# Patient Record
Sex: Male | Born: 2000 | Race: Black or African American | Hispanic: No
Health system: Southern US, Community
[De-identification: ages and names within clinical notes are randomized; demographics above are authoritative.]

## PROBLEM LIST (undated history)

## (undated) DIAGNOSIS — L309 Dermatitis, unspecified: Secondary | ICD-10-CM

## (undated) DIAGNOSIS — E063 Autoimmune thyroiditis: Secondary | ICD-10-CM

## (undated) DIAGNOSIS — E0500 Thyrotoxicosis with diffuse goiter without thyrotoxic crisis or storm: Secondary | ICD-10-CM

## (undated) HISTORY — DX: Autoimmune thyroiditis: E06.3

## (undated) HISTORY — PX: REDUCTION OF TORSION OF TESTIS: SUR1096

---
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
## 2022-01-17 ENCOUNTER — Encounter (INDEPENDENT_AMBULATORY_CARE_PROVIDER_SITE_OTHER): Payer: Self-pay

## 2022-01-31 ENCOUNTER — Encounter (INDEPENDENT_AMBULATORY_CARE_PROVIDER_SITE_OTHER): Payer: Self-pay

## 2022-02-11 NOTE — Progress Notes (Unsigned)
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   HISTORY OF PRESENT ILLNESS:   Corey Charles is a 21 y.o. African-American young man.  Corey Charles was unaccompanied.  1. Corey Charles's initial pediatric endocrine evaluation occurred on 04/18/18.   A. Perinatal history: Gestational Age: [redacted]w[redacted]d; 8 lb (3.629 kg); Healthy newborn  B. Infancy: Healthy  C. 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 No other surgeries; No allergies to medications, but he does have seasonal allergies, for which he takes Zyrtec.  D. Chief complaint:   1). He went to a neurologist at [facility] in the Spring of 2018 for evaluation of a tremor. His thyroid tests were hyperthyroid. Corey Charles was also having a fast heart rate, insomnia early awakening, and weight loss associated with eating less. He was also somewhat more irritable. He was anxious and also had difficulty with concentrating and thinking. He did not have any [place] or nuclear  medicine studies. He started on propranolol which helped his tremor, heart rate, and sleeping difficulties.  He also started on methimazole (MTZ). The propranolol was tapered prior to starting school in August 2018.    2). His symptoms and his MTZ doses have varied with time. He currently takes 10 mg of MTZ per day. Family became concerned that he saw a different doctor every time he went to [name], so they requested a referral to [place]. The Corey Charles record, however, indicated that he saw Corey Charles for all of his pediatric endocrinology visits, most recently on 08/20/17. At that visit he was supposed to be taking 15 mg of MTZ twice daily. He was  supposed to have lab tests drawn and return to clinic in 4 months. After reviewing the lab results from that visit, Corey Charles reduced the MTZ dose to 15 mg daily.     3). On 09/27/17, presumably after reviewing the lab results from 09/19/17,  Corey Charles reduced his MTZ dose to 5 mg/day. Corey Charles was supposed to repeat labs in 2 weeks. There is no clinic record of any review of the TFTs from 10/29/17, but I couldn't access the patient's portal to see if there was any further communication with the family.   E. Pertinent family history:   1). Thyroid disease: Paternal aunt has hypothyroidism, but mom does not know why. Maternal great grandmother had a thyroidectomy, but mom also does not know why.    2). Obesity: Mom weighs 330 pounds. Maternal great grandmother weighs 600 pounds.    3). DM: Maternal half-uncle had juvenile diabetes. Paternal aunt with hypothyroidism also has T2DM.    4). ASCVD: Some heart disease on dad's side.   5). Cancers: Some on dad's side.   7). Others: Paternal grandfather died before the age of 62 due to lupus. Familial tremor in mom, maternal grandmother, maternal aunt. [Addendum 10/10/21: Mother, sister, and several other family members have hand tremor.]  F. Lifestyle:   1). Family diet: Balanced American diet   2). Physical activities: He used to run x-country.  G. On physical exam, Corey Charles's heart rate was 84. He was alert, but somewhat mentally sluggish. His eyes were normal. He had a 1+ tongue tremor and grade 1-2+ bruits. He had a diffusely enlarged thyroid gland  that was 21 grams in size. He had a 2-+ gross tremor of both hands and trace palmar erythema. TSH was 0.01, free T4 3.7 (ref 0.8-1.4), free T3 17.5 (ref 3.0-4.7), TSI 279, TPO antibody >900, and thyroglobulin antibody <1.   H. Assessment and Plan: It appeared that Corey Charles had both Graves' disease and Hashimoto's thyroiditis, but the Graves' disease was dominant. I increased his methimazole dose to 20 mg, twice daily.    2. Clinical course:  A. During the next year, Corey Charles's TFTs fluctuated between hypothyroidism and hyperthyroidism, but were mostly hyperthyroid. Part of the problem was the interplay of Graves' disease, Hashimoto's disease, and methimazole therapy. Part of the problem was Corey Charles's inconsistency in taking methimazole.   B. At [facility] clinic visit on 05/07/19, we discussed Corey Charles's case and his inability to take methimazole consistently.  I continued his methimazole dose of 60 mg, three times daily and reduced his Inderal to 10 mg, twice daily. I discussed the option of thyroidectomy with Corey Charles and his mother. They asked me to arrange a surgical consultation.   C. On 05/11/19, I called Corey Charles, an endocrine surgeon  at [facility], to ask her to see Corey Charles and his family for the option of thyroid surgery. She graciously agreed to do so. Corey Charles saw Corey Charles on 05/19/19. She then performed a total thyroidectomy on 06/01/19. 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   D. Corey Charles started Corey Charles on levothyroxine, 112 mcg/day after surgery. When Corey Charles saw Corey Charles in follow up on 06/16/19 he was doing well. He has fully recovered since then.   E. I have attempted to increase his levothyroxine doses over time, but he has frequently been non-compliant with taking the medication.  F. On 11/21/20 Corey Charles passed out at school. He was taken to urgent care, where his HR was in the 40s and his CBG was 51. His TFTs were profoundly low, in large part due to him not taking his thyroid hormone reliably.. I increased his thyroid hormone dose to 250 mcg/day. He was supposed to have repeated his TFTs in 6 weeks, but did not. He also was a No show for follow up appointments in 12/07/20, 02/01/21, 03/08/21 and cancelled 2 appointments on 03/13/21.4  3. [facility] visit occurred on  10/10/21. At that visit I continued his levothyroxine dose of 175 mcg/day for 6 days each week and 1/2 tablet on the seventh days.  He was supposed to have repeat lab tests for the second time, but did not.    A. In the interim he has been healthy.  B. He is tired, in part due to not getting as much sleep and in part to missing some doses of thyroid hormone. He has had a lot of homework and has been involved in a Consulting civil engineer. He is not unusually warm or cold. Mental functioning is good. He is "fine" emotionally.  His appetite is normal. He is eating more regularly to prevent low BG.   C. He continues on his levothyroxine dosage of 175 mcg/day for 6 days each week and 1/2 tablet on the seventh days. He says he has missed many doses, averaging about 2-3 per week.     4. Pertinent Review of Systems:  Constitutional: Randale feels "fine, but tired". He can't remember as well.   Eyes: Vision seems to be good with his glasses. There is no sensation of restriction to upward  and lateral eye movements. There are no recognized eye problems. Neck: The patient has had some recent complaints of soreness in his anterior neck adjacent to the surgical scar.    Heart: Heart rate increases with exercise or other physical activity. He has no complaints of palpitations, irregular heart beats, chest pain, or chest pressure.   Gastrointestinal: Bowel movents seem normal. He has no complaints of excessive hunger, acid reflux, upset stomach, stomach aches or pains, diarrhea, or constipation.  Hands: He still has some tremor.   Legs: Muscle mass and strength seem pretty normal. There are no complaints of numbness, tingling, burning, or pain. No edema is noted. He can go up and down stairs well.  Feet: There are no complaints of numbness, tingling, burning, or pain. No edema is noted. Neurologic: There are no recognized problems with muscle movement and strength, sensation, or coordination. GU: He has full pubic hair  and axillary hair.   PAST MEDICAL, FAMILY, AND SOCIAL HISTORY  Past Medical History:  Diagnosis Date   Eczema    Graves disease    Hashimoto's disease     Family History  Problem Relation Age of Onset   Post-traumatic stress disorder Father    Hypertension Father    Hyperlipidemia Father    Hypertension Maternal Grandmother    Lupus Paternal Grandfather      Current Outpatient Medications:    acetaminophen (TYLENOL) 500 MG tablet, Take 2 tablets (1,000 mg total) by mouth every 6 (six) hours. (Patient not taking: Reported on 03/23/2020), Disp: 30 tablet, Rfl: 0   Calcium-Phosphorus-Vitamin D (CALCIUM GUMMIES PO), Take 4 tablets by mouth daily., Disp: , Rfl:    calcium-vitamin D (OSCAL 500/200 D-3) 500-200 MG-UNIT tablet, Take 1 tablet by mouth 3 (three) times daily for 14 days., Disp: 42 tablet, Rfl: 0   cetirizine (ZYRTEC) 10 MG chewable tablet, Chew 10 mg by mouth daily as needed (allergies.)., Disp: , Rfl:    FLUoxetine (PROZAC) 20 MG capsule, Take 20 mg by mouth daily.  (Patient not taking: Reported on 03/20/2021), Disp: , Rfl:    HYDROCORTISONE EX, Apply 1 application. topically 4 (four) times daily as needed (eczema)., Disp: , Rfl:    hydrOXYzine (VISTARIL) 25 MG capsule, Take 25 mg by mouth at bedtime., Disp: , Rfl:    Lactobacillus Rhamnosus, GG, (CULTURELLE) CAPS, Take by mouth., Disp: , Rfl:    levothyroxine (SYNTHROID) 125 MCG tablet, Take two tablets every morning., Disp: 60 tablet, Rfl: 6   levothyroxine (SYNTHROID) 175 MCG tablet, TAKE 1 TABLET BY MOUTH DAILY FOR 6 DAYS A WEEK, TAKE 1/2 TABLET ON SEVENTH DAY, Disp: 90 tablet, Rfl: 3   Melatonin 3 MG TABS, Take 3 mg by mouth at bedtime as needed (sleep).  (Patient not taking: Reported on 12/22/2019), Disp: , Rfl:    polyethylene glycol powder (GLYCOLAX/MIRALAX) 17 GM/SCOOP powder, Take 17 g by mouth daily as needed (constipation)., Disp: , Rfl:    Probiotic Product (PROBIOTIC PO), Take 1 capsule by mouth daily as needed  (digestive regularity). (Patient not taking: Reported on 12/22/2019), Disp: , Rfl:    Skin Protectants, Misc. (EUCERIN) cream, Apply 1 application topically 4 (four) times daily as needed (eczema).  (Patient not taking: Reported on 03/23/2020), Disp: , Rfl:    tretinoin (RETIN-A) 0.05 % cream, , Disp: , Rfl:   Allergies as of 02/12/2022   (No Known Allergies)     reports that she has never smoked. She has been exposed to tobacco smoke. She has never  used smokeless tobacco. She reports that she does not drink alcohol and does not use drugs. Pediatric History  Patient Parents   Durward Mallard (Mother)   Hoffmaster,DONTEZ (Father)   Other Topics Concern   Not on file  Social History Narrative   Lives at with mom, brother, and dad.    He will start 11th grade at [employer].    He enjoys acting, walking, and hanging out with friends.     1. School and Family: He is in the 12th grade. He has applied to several colleges.  He thinks he wants to go to community college for two years and then go on to a full college program. He is doing well. He lives with his parents and brother. 2. Activities: He is more active. He has PE class at school. He participates in many extra-curricular activities, to include a Consulting civil engineer .   3. Primary Care Provider: Jaci Standard, MD  REVIEW OF SYSTEMS: There are no other significant problems involving Rahmir's other body systems.    Objective:  Objective  Vital Signs:  There were no vitals taken for this visit.   Ht Readings from Last 3 Encounters:  10/10/21 6' 2.41" (1.89 m) (96 %, Z= 1.79)*  03/20/21 6' 2.02" (1.88 m) (96 %, Z= 1.70)*  09/01/20 6' 3.2" (1.91 m) (99 %, Z= 2.20)*   * Growth percentiles are based on CDC (Boys, 2-20 Years) data.   Wt Readings from Last 3 Encounters:  10/10/21 164 lb 9.6 oz (74.7 kg) (71 %, Z= 0.56)*  06/29/21 157 lb 3.2 oz (71.3 kg) (64 %, Z= 0.35)*  03/20/21 164 lb 12.8 oz (74.8 kg) (75 %, Z= 0.66)*    * Growth percentiles are based on CDC (Boys, 2-20 Years) data.   HC Readings from Last 3 Encounters:  No data found for [facility]   There is no height or weight on file to calculate BSA. No height on file for this encounter. No weight on file for this encounter.  PHYSICAL EXAM:  Constitutional: Blaydon looks good today. His height is plateauing about the 96.35%. He gained 7 pounds to the 71.31%. His BMI had decreased to the 33.27%. He is alert and more interactive today. His affect is still relatively flat. His insight is normal. Ears: Normal range of motion.  Eyes: There is no arcus or proptosis. He has a full  Mouth: The oropharynx appears normal. The tongue appears normal. There is no tongue tremor.  There is normal oral moisture. There is no obvious gingivitis. Neck: There are no bruits present. The thyroid gland is absent. He has the typical post-thyroidectomy induration of his strap muscles, more on the left than on the right..  Lungs: The lungs are clear. Air movement is good. Heart: The heart rhythm and rate appear normal. Heart sounds S1 and S2 are normal. I do not appreciate any pathologic heart murmurs. Abdomen: The abdomen is normal in size. Bowel sounds are normal. The abdomen is soft and non-tender. There is no obviously palpable hepatomegaly, splenomegaly, or other masses.  Arms: Muscle mass appears appropriate for age.His fingernails are no longer painted black.  Hands: There is a 1+ tremor. Phalangeal and metacarpophalangeal joints appear normal. Palms are normal. Several fingernails are pallid.  Legs: Muscle mass appears appropriate for age. There is no edema.  Neurologic: Muscle strength is normal for age and gender in the upper extremities. The muscle strength is the lower extremities is better. Muscle tone appears normal.  Sensation to touch is normal in the legs.  LAB DATA:   No results found for this or any previous visit (from the past 672 hour(s)).   Labs 10/10/21: CMP  normal; CBC normal, except WBC 4.0 (ref 4.5-13.0), iron 91 (ref 27-164)  Labs 06/16/21: TSH 4.16, free T4 1.9, free T3 4.3, TSI <89; CMP normal; CBC normal, except WBC 3.7 (ref 4.5-13.0) and neutrophils 1158 (ref 1200-5200) iron 91  Labs 03/20/21: TSH >150, free T4 , free T3 1.3; CMP normal, except creatinine 1.22 (ref 0.60-1.24) ; CBC normal, except WBC 4.0 (ref 4.6-15.), neutrophils 1112 (ref 1200-5200) and MCV 98.6 (ref 78-98) , iron 128  Labs 11/21/20: TSH 104.379, free T4 0.74, free T3 1.6  Labs 03/23/20: TSH 0.38, free T4 1.9, free T3 4.9; CMP normal; CBC normal, except WBC count 3.5 (ref 4.5-13.0) and neutrophils 1705 (ref 1800-8000)  Labs 12/22/19: TSH >150, free T4 0.8, free T3 1.1; CMP normal, except creatinine 1.55, CBC normal, except WBC 3.7 (ref 4.5-5.7)  Labs 08/20/19: TSH 73.04, free T4 1.2, free T3 2.1 (ref 3.0-4.7); potassium 3.7  Labs 04/30/19: TSH 0.01, free T4 1.6, free T3 5.7%, TSI 121 (ref <140, but many endocrinologist want the TSI to be <100)  Labs 03/11/19: TSH <0.01, free T4 1.6, free T3 5.4. TSI 244; CBC normal except WBC 4.4 (ref 4.5-13.0, but increased from 4.1 in August 2020); CMP normal, except ALT 34 (ref 7-32, but decreased from 63 in August 2020  Labs 02/05/19: TSH <0.01, free T4 1.8, free T3 6.3, TSI 268  Labs 11/24/18; TSH 0.01, free T4 3.2, free T3 15, TSI 230; CBC normal, except WBC 3.3 (ref 4.5-13.0) and neutrophils 1548 (ref 1800-8000)  Labs 09/01/18: TSH <0.01, free T4 2.7, free T3 9.0, TSI  155; CMP normal, except ALT 36 (ref 7-32); CBC normal, except WBC 3.7 (ref 4.5-13.0)  Labs 07/23/18: TSH 0.01, free T4 1.9, free T3 6.1, TSI 173, CMP normal; CBC with WBC 3.2, PMNs 1,408  Labs 06/23/18: TSH 0.01, free T4 2.6, free T3 11.1, TSI 165; CMP normal; CBC with WBC 5.1, PMNs 3,570  Labs 05/23/18: TSH 0.1, free T4 2.1, free T3 8.0; CBC normal, except WBC 3.7 (ref 4.5-13.0), PMNs 1.769 (ref 1800-8000)  Labs 04/26/18: TSH 0.01, free T4 3.7 (ref 0.8-1.4), free T3  17.5 (ref 3.0-4.7), TSI 279 (ref <140), TPO antibody >900 (reg <9), thyroglobulin antibody <1; CMP normal; CBC normal, except WBC 3.5 and PMNs 1,488   Labs 10/29/17: TSH 3.481, free T4 1.00, total T3 1.7  Labs 09/19/17: TSH 12.48, free T4 0.82, TSI 2.1  Labs 08/20/17: TSH 12.30, free T4 0.71, T3 1.8, TRAb 3.54 (ref 0-1.75)  Labs 04/09/17: TSH 6.437, free T4 0.58, T3 1.6  Labs 02/14/17: TSH <0.015, free T4 1.31, T3 2.6  Labs 01/15/17: TSH <0.015, free T4 2.14, T3 2.9  Labs 01/01/17: TSH <0.015, free T4 2.79, T3 3.2  Labs 6.26.18: TSH <0.015, free T4 2.75, T3 3.6  Labs 11/22/16: TSH <0.015, free T4 3.05, T3   Labs 11/06/16: TSH <0.020, free T4 4.0 (ref 0.80-2.0), total T3 6.4 (ref 1.-1.7), TSI 5.7 (ref < 1.3)    Assessment and Plan:  Assessment  ASSESSMENT:  1-3. Diffuse thyrotoxicosis with goiter (Graves disease)/Hashimoto's thyroiditis/post-surgical hypothyroidism:  A. Oluwajomiloju had the clinical history, physical exam, and lab evidence for the combination of Graves' Disease and Hashimoto's disease. .   B. Due to our inability to control his Graves' disease with methimazole, I referred him for surgical consultation to Corey.  Duanne Guess. Corey. Lady Gary performed a total thyroidectomy in December 2020. His TFTs in March 2021 were quite hypothyroid, so I increased his levothyroxine dose to 137 mcg/day. His TFTs were even more hypothyroid in July 2021, so I increased his dose even more.   Corey Charles has recovered nicely. He appears to be clinically euthyroid today, or perhaps a bit hypothyroid. It would be helpful if he would reliably take his levothyroxine. We will repeat his TFTs now.   D. We will follow his clinical course and TFTs over time. We will attempt to achieve a TSH value in the goal range of 1.0-2.0.    4. Tremor: This finding was due to a combination of thyrotoxicosis and familial tremor. The hand tremor and tongue tremor had resolved at his July 2021 visit. The hand tremor was present  again at his last two visits and is still present mildly today. 5. Weight loss, unintentional: He has re-gained the weight he lost. He says his appetite is better.   6. Neutropenia, relative:   A. Corey Charles had a relatively low WBC count and relatively low neutrophil count in November 2019. This condition is a normal variant seen in African-Americans, especially African-American men.   B. In September 2020 his WBC was higher than it had been in the past year. These relatively low counts did not appear to be due to MTZ treatment. His WBC count was lower in July 2021, many months after stopping methimazole.  His neutrophil count and PMN count in October were below the reference range again and were a bit lower.  7. Elevated transaminase:   A. His AST and ALT were both elevated in August 2020. Although I increased his MTZ doses in August, his AST had normalized in September. His ALT in September was still elevated, but much lower than in August. His LFTs in March, July, and October 2021 were normal.   B. The fact that his LFTs improved on higher doses of MTZ indicates that the elevations in LFTs were due to his hyperthyroidism, not due to his MTZ.  8. 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   9. Pallor: We need to check his CBC and iron today.  10. Noncompliance: Corey Charles did not cooperate very well with taking MTZ in the past. He says he is doing a better job now of taking thyroid hormone, but still misses doses frequently. He needs to take his levothyroxine regularly.    PLAN:  1. Diagnostic:  Repeat TFTs, CMP, CBC, and iron today 2. Therapeutic: Continue the levothyroxine dose of 175 mcg/day for 6 days each week, but 1/2 tablet on the seventh days.    3. Patient education: We discussed all of the above at great length.  4. Follow-up: 4 months     Level of Service: This visit lasted in excess of 55 minutes. More than 50% of the visit was devoted to counseling Corey Charles.   Molli Knock,  MD, CDE Pediatric and Adult Endocrinology

## 2022-02-12 ENCOUNTER — Encounter (INDEPENDENT_AMBULATORY_CARE_PROVIDER_SITE_OTHER): Payer: Self-pay | Admitting: "Endocrinology

## 2022-02-12 ENCOUNTER — Ambulatory Visit (INDEPENDENT_AMBULATORY_CARE_PROVIDER_SITE_OTHER): Payer: BC Managed Care – PPO | Admitting: "Endocrinology

## 2022-02-12 VITALS — BP 112/70 | HR 70 | Ht 74.41 in | Wt 163.4 lb

## 2022-02-12 DIAGNOSIS — D708 Other neutropenia: Secondary | ICD-10-CM | POA: Diagnosis not present

## 2022-02-12 DIAGNOSIS — R231 Pallor: Secondary | ICD-10-CM | POA: Diagnosis not present

## 2022-02-12 DIAGNOSIS — F649 Gender identity disorder, unspecified: Secondary | ICD-10-CM

## 2022-02-12 DIAGNOSIS — R251 Tremor, unspecified: Secondary | ICD-10-CM

## 2022-02-12 DIAGNOSIS — E890 Postprocedural hypothyroidism: Secondary | ICD-10-CM

## 2022-02-12 DIAGNOSIS — R7401 Elevation of levels of liver transaminase levels: Secondary | ICD-10-CM

## 2022-02-12 NOTE — Patient Instructions (Signed)
Janelle Floor will follow up with  Dr. Vanessa Adams in 4 months.   At [facility], we are committed to providing exceptional care. You will receive a patient satisfaction survey through text or email regarding your visit today. Your opinion is important to me. Comments are appreciated.

## 2022-02-13 LAB — COMPREHENSIVE METABOLIC PANEL
AG Ratio: 1.7 (calc) (ref 1.0–2.5)
ALT: 9 U/L (ref 8–46)
AST: 15 U/L (ref 12–32)
Albumin: 4.5 g/dL (ref 3.6–5.1)
Alkaline phosphatase (APISO): 52 U/L (ref 46–169)
BUN: 10 mg/dL (ref 7–20)
CO2: 22 mmol/L (ref 20–32)
Calcium: 9.3 mg/dL (ref 8.9–10.4)
Chloride: 105 mmol/L (ref 98–110)
Creat: 1.1 mg/dL (ref 0.60–1.24)
Globulin: 2.7 g/dL (calc) (ref 2.1–3.5)
Glucose, Bld: 63 mg/dL — ABNORMAL LOW (ref 65–139)
Potassium: 3.8 mmol/L (ref 3.8–5.1)
Sodium: 140 mmol/L (ref 135–146)
Total Bilirubin: 0.6 mg/dL (ref 0.2–1.1)
Total Protein: 7.2 g/dL (ref 6.3–8.2)

## 2022-02-13 LAB — IRON: Iron: 118 ug/dL (ref 27–164)

## 2022-02-13 LAB — TSH: TSH: 62.7 mIU/L — ABNORMAL HIGH (ref 0.50–4.30)

## 2022-02-13 LAB — T4, FREE: Free T4: 1 ng/dL (ref 0.8–1.4)

## 2022-02-13 LAB — T3, FREE: T3, Free: 2.3 pg/mL — ABNORMAL LOW (ref 3.0–4.7)

## 2022-03-16 ENCOUNTER — Encounter (INDEPENDENT_AMBULATORY_CARE_PROVIDER_SITE_OTHER): Payer: Self-pay

## 2022-05-23 ENCOUNTER — Encounter (INDEPENDENT_AMBULATORY_CARE_PROVIDER_SITE_OTHER): Payer: Self-pay | Admitting: Pediatric Endocrinology

## 2022-05-23 ENCOUNTER — Ambulatory Visit (INDEPENDENT_AMBULATORY_CARE_PROVIDER_SITE_OTHER): Payer: BC Managed Care – PPO | Admitting: Pediatric Endocrinology

## 2022-05-23 VITALS — BP 112/70 | HR 58 | Ht 73.98 in | Wt 158.0 lb

## 2022-05-23 DIAGNOSIS — E890 Postprocedural hypothyroidism: Secondary | ICD-10-CM

## 2022-05-23 DIAGNOSIS — F649 Gender identity disorder, unspecified: Secondary | ICD-10-CM

## 2022-05-23 MED ORDER — LEVOTHYROXINE SODIUM 175 MCG PO TABS: ORAL_TABLET | ORAL | 3 refills | Status: DC

## 2022-05-23 NOTE — Progress Notes (Unsigned)
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 Pronouns are "she, her, and hers".   HISTORY OF PRESENT ILLNESS:   Corey Charles is a 21 y.o. African-American young woman (Assigned male at birth - AAMB).  Corey Charles was unaccompanied.  1. Corey Charles's  initial pediatric endocrine evaluation occurred on 04/18/18. She went to a neurologist at [facility] in the Spring of 2018 for evaluation of a tremor. Her thyroid tests were hyperthyroid. Corey Charles was also having a fast heart rate, insomnia early awakening, and weight loss associated with eating less. She was also somewhat more irritable. She was anxious and also had difficulty with concentrating and thinking. TSH was 0.01, free T4 3.7 (ref 0.8-1.4), free T3 17.5 (ref 3.0-4.7), TSI 279, TPO antibody >900, and thyroglobulin antibody <1.  Corey Charles had a total thyroidectomy on 06/01/19. .  2. Corey Charles's last Pediatric Specialists visit occurred on 02/12/22 with Dr. Fransico Michael. At that visit she disclosed her gender identity as being male. Dr. Fransico Michael recommended that she follow up with me for her next visit.   She is currently meant to be taking 175 mcg of levothyroxine daily. She feels that in general she has done well with this dose. She says that she did miss a few doses over the fall holiday.  She denies any symptoms of either hypo or hyperthyroidism.   She has come to the point in her life where she is ready to start working on her gender affirmation. She is working with an Manufacturing systems engineer. Her family is aware and is supportive of her transition. She has transitioned socially and is using Corey Charles and she/her pronouns all of the time.   She is taking classes at [employer].   She  is interested in learning about options for Gender Affirming Hormonal Therapy. She would like to start soon.   She is interested in blockers if they are covered by her insurance. She is not interested in preservation of sexual function or fertility. She is hoping to have "bottom surgery" in the future.   Corey Charles says that she fully realized that she was male when she was about 62. When she realized it she also realized that she had "always felt that way". She remembered behaviors from when she was a child that she had pushed to the back of her mind thinking that she was just an "effeminate gay guy".  She remembers that when she would pretend to be an actor from a show she always picked male characters. She says that her father expected her to have pretty rigid gender toys/clothes. She did not have dolls or shoes or anything fancy but she would try to "dress up" using clothing anyway. She always wanted to be "a real woman". She has always wanted longer hair- but her parents always cut her hair. Because her hair was "4C hair" it was always really difficult to manage. Once she got older and could manage her hair she started to grow it out. She says that now that her hair is shoulder length she feels "so much better". She felt that her hair never matched her inner image of what she should look like. She has had an uncomfortabl-ity with her body. She has desired to look different in the way  that she wanted her body to look more feminine with softer appeal. She also has concerns about her jaw line, her height, and her voice tone. She hated looking at video of herself walking/talking/carrying herself. She felt that she always projected too masculine.   Once she got to middle/high school she learned about the [employer] and became much more comfortable with herself. She felt that she would have liked to have been born a girl but did not self identify as male. She thinks that this was internalized fear based  on messaging she was getting from her dad about what it meant to be a man.   She came out as gay at age 53 (2020). Her mom took it "pretty fine" but also said that she thought it was "just a phase" and "we all go through that".  She did not intentionally disclose to dad but he let her know that he had heard and that he would always support her and she would always be his son. However, after her parents separated and she was staying with him during the Covid Pandemic he started into a lot of anti-feminine talk and "I know you're gay BUT...". She has told him that she is transgender but he has never responded. She lives full time with her mom now. She does have some interactions with her dad but they do not talk about her gender.   She denies every trying to hurt herself related to dysphoria.   She "came out" to everyone as trans in 2023 through a very long text message with a full explanation of who she is. She says that her mom was supportive. Mom is usually pretty good but she sometimes slips up. She is currently getting it wrong more often than she gets it right. Corey Charles does not think it's intentional but she will dead-name her and use "he/him" pronouns. Corey Charles says that she does not correct her mom. She tries not to feel defensive about it. She doesn't want to have to correct her mom every time she opens her mouth.     3. Pertinent Review of Systems:  Constitutional: Corey Charles feels "pretty good". Her memory is better.   Eyes: Vision seems to be good with her glasses. There are no recognized eye problems. Neck: No complaints of soreness in her anterior neck adjacent to the surgical scar.    Heart: Heart rate increases with exercise or other physical activity. She has no complaints of palpitations, irregular heart beats, chest pain, or chest pressure.   Gastrointestinal: Bowel movents seem normal. She has no complaints of excessive hunger, acid reflux, upset stomach, stomach aches or pains, diarrhea, or  constipation.  Lungs: She sometimes has a "weird feeling" in her rib cage when she breathes deeply  Hands: She still has some tremor.   Legs: Muscle mass and strength seem pretty normal. There are no complaints of numbness, tingling, burning, or pain. No edema is noted. She can go up and down stairs well.  Feet: There are no complaints of numbness, tingling, burning, or pain. No edema is noted. Neurologic: There are no recognized problems with muscle movement and strength, sensation, or coordination. GU: She has full pubic hair and axillary hair.  Gender identification: She now identifies as male.    PAST MEDICAL, FAMILY, AND SOCIAL HISTORY  Past Medical History:  Diagnosis Date   Eczema    Graves disease    Hashimoto's disease     Family History  Problem Relation Age of Onset  Post-traumatic stress disorder Father    Hypertension Father    Hyperlipidemia Father    Hypertension Maternal Grandmother    Lupus Paternal Grandfather      Current Outpatient Medications:    levothyroxine (SYNTHROID) 175 MCG tablet, TAKE 1 TABLET BY MOUTH DAILY FOR 6 DAYS A WEEK, TAKE 1/2 TABLET ON SEVENTH DAY, Disp: 90 tablet, Rfl: 3   Multiple Vitamins-Minerals (MULTIVITAMIN ADULTS) TABS, Take by mouth., Disp: , Rfl:    acetaminophen (TYLENOL) 500 MG tablet, Take 2 tablets (1,000 mg total) by mouth every 6 (six) hours. (Patient not taking: Reported on 03/23/2020), Disp: 30 tablet, Rfl: 0   Calcium-Phosphorus-Vitamin D (CALCIUM GUMMIES PO), Take 4 tablets by mouth daily. (Patient not taking: Reported on 02/12/2022), Disp: , Rfl:    calcium-vitamin D (OSCAL 500/200 D-3) 500-200 MG-UNIT tablet, Take 1 tablet by mouth 3 (three) times daily for 14 days., Disp: 42 tablet, Rfl: 0   cetirizine (ZYRTEC) 10 MG chewable tablet, Chew 10 mg by mouth daily as needed (allergies.). (Patient not taking: Reported on 02/12/2022), Disp: , Rfl:    FLUoxetine (PROZAC) 20 MG capsule, Take 20 mg by mouth daily.  (Patient not  taking: Reported on 03/20/2021), Disp: , Rfl:    HYDROCORTISONE EX, Apply 1 application. topically 4 (four) times daily as needed (eczema). (Patient not taking: Reported on 02/12/2022), Disp: , Rfl:    hydrOXYzine (VISTARIL) 25 MG capsule, Take 25 mg by mouth at bedtime. (Patient not taking: Reported on 02/12/2022), Disp: , Rfl:    Lactobacillus Rhamnosus, GG, (CULTURELLE) CAPS, Take by mouth. (Patient not taking: Reported on 02/12/2022), Disp: , Rfl:    levothyroxine (SYNTHROID) 125 MCG tablet, Take two tablets every morning., Disp: 60 tablet, Rfl: 6   Melatonin 3 MG TABS, Take 3 mg by mouth at bedtime as needed (sleep).  (Patient not taking: Reported on 12/22/2019), Disp: , Rfl:    polyethylene glycol powder (GLYCOLAX/MIRALAX) 17 GM/SCOOP powder, Take 17 g by mouth daily as needed (constipation). (Patient not taking: Reported on 02/12/2022), Disp: , Rfl:    Probiotic Product (PROBIOTIC PO), Take 1 capsule by mouth daily as needed (digestive regularity). (Patient not taking: Reported on 12/22/2019), Disp: , Rfl:    Skin Protectants, Misc. (EUCERIN) cream, Apply 1 application topically 4 (four) times daily as needed (eczema).  (Patient not taking: Reported on 03/23/2020), Disp: , Rfl:    tretinoin (RETIN-A) 0.05 % cream, , Disp: , Rfl:   Allergies as of 05/23/2022   (No Known Allergies)     reports that she has never smoked. She has been exposed to tobacco smoke. She has never used smokeless tobacco. She reports that she does not drink alcohol and does not use drugs. Pediatric History  Patient Parents   Augustin Coupe (Mother)   Radilla,DONTEZ (Father)   Other Topics Concern   Not on file  Social History Narrative   Lives at with mom, brother, and dad.    She graduated at [employer]. 22-23,   Currently Freshman at [employer] doing Myrtha Mantis right now 23-24 school year.   She enjoys acting, walking, and hanging out with friends.     1. School and Family: She graduated  from high school in May 2023. She is working and going to school at [employer]. She lives with her mom and brother. They are both supportive of her transition.  2. Activities: she wants to go into interior design.   3. Primary Care Provider: Renaee Munda,  MD  REVIEW OF SYSTEMS: There are no other significant problems involving Ollivander's other body systems.    Objective:  Objective  Vital Signs:   BP 112/70 (BP Location: Right Arm, Patient Position: Sitting, Cuff Size: Large)   Pulse (!) 58   Ht 6' 1.98" (1.879 m)   Wt 158 lb (71.7 kg)   BMI 20.30 kg/m    Ht Readings from Last 3 Encounters:  05/23/22 6' 1.98" (1.879 m) (94 %, Z= 1.60)*  02/12/22 6' 2.41" (1.89 m) (96 %, Z= 1.77)*  10/10/21 6' 2.41" (1.89 m) (96 %, Z= 1.79)*   * Growth percentiles are based on CDC (Boys, 2-20 Years) data.   Wt Readings from Last 3 Encounters:  05/23/22 158 lb (71.7 kg) (59 %, Z= 0.23)*  02/12/22 163 lb 6.4 oz (74.1 kg) (68 %, Z= 0.47)*  10/10/21 164 lb 9.6 oz (74.7 kg) (71 %, Z= 0.56)*   * Growth percentiles are based on CDC (Boys, 2-20 Years) data.   HC Readings from Last 3 Encounters:  No data found for [facility]   Body surface area is 1.93 meters squared. 94 %ile (Z= 1.60) based on CDC (Boys, 2-20 Years) Stature-for-age data based on Stature recorded on 05/23/2022. 59 %ile (Z= 0.23) based on CDC (Boys, 2-20 Years) weight-for-age data using vitals from 05/23/2022.  PHYSICAL EXAM: ***  Constitutional: Corey Charles looks good today,  but is masked. Her height is plateauing about the 96.16%. She lost one pound to the 68.04%. Her BMI has decreased to the 28.30%. She is alert, but still very reserved. Her affect is still relatively flat. Her insight is normal. Today she wears a pearl necklace and very feminine loop-type ear rings. She also carries a white purse with a small pink stuffed animal on her handle. She wears a mask to hide her facial hair.  Ears: Normal range of motion.  Eyes: There is  no arcus or proptosis. She has a full range of EOMs.  Mouth: The oropharynx appears normal. The tongue appears normal. There is no tongue tremor.  There is normal oral moisture. There is no obvious gingivitis. Neck: There are no bruits present. The thyroid gland is absent. She has the typical post-thyroidectomy induration of her strap muscles, more on the left than on the right..  Lungs: The lungs are clear. Air movement is good. Heart: The heart rhythm and rate appear normal. Heart sounds S1 and S2 are normal. I do not appreciate any pathologic heart murmurs. Abdomen: The abdomen is normal in size. Bowel sounds are normal. The abdomen is soft and non-tender. There is no obviously palpable hepatomegaly, splenomegaly, or other masses.  Arms: Muscle mass appears appropriate for age.  Hands: There is a trace tremor. Phalangeal and metacarpophalangeal joints appear normal. Palms are normal. Fingernails have a feminine polish.   Legs: Muscle mass appears appropriate for age. There is no edema.  Neurologic: Muscle strength is normal for age and gender in the upper extremities. The muscle strength is the lower extremities is better. Muscle tone appears normal. Sensation to touch is normal in the legs.  LAB DATA: ***  No results found for this or any previous visit (from the past 672 hour(s)).   Lab Results  Component Value Date   TSH 62.70 (H) 02/12/2022   TSH 4.16 06/16/2021   TSH >150.00 (H) 03/20/2021   TSH 104.379 (H) 11/21/2020   Lab Results  Component Value Date   FREET4 1.0 02/12/2022   FREET4 1.9 (H) 06/16/2021  FREET4 0.5 (L) 03/20/2021   FREET4 0.74 11/21/2020      Assessment and Plan:  Assessment  ASSESSMENT:***  1-3. Diffuse thyrotoxicosis with goiter (Graves disease)/Hashimoto's thyroiditis/post-surgical hypothyroidism:  A. Corey Flooraomi had the clinical history, physical exam, and lab evidence for the combination of Graves' Disease and Hashimoto's disease. .   B. Due to our  inability to control her Graves' disease with methimazole, I referred her for surgical consultation to Dr. Duanne GuessJennifer Cannon. Dr. Lady Garyannon performed a total thyroidectomy in December 2020. Her TFTs in March 2021 were quite hypothyroid, so I increased her levothyroxine dose to 137 mcg/day. Her TFTs were even more hypothyroid in July 2021, so I increased her dose even more.   Wendie Simmer. Corey Charles has recovered nicely. She appears to be clinically euthyroid today, or perhaps a bit hypothyroid. It would be helpful if she would reliably take her levothyroxine. We will repeat her TFTs now.   D. We will follow her clinical course and TFTs over time. We will attempt to achieve a TSH value in the goal range of 1.0-2.0.    4. Tremor: This finding was due to a combination of thyrotoxicosis and familial tremor. The hand tremor and tongue tremor had resolved at her July 2021 visit. The hand tremor was present again at her last three visits and is still present mildly today. 5. Weight loss, unintentional: She has re-gained the weight she lost. She says her appetite is better.   6. Neutropenia, relative:   A. Corey Flooraomi had a relatively low WBC count and relatively low neutrophil count in November 2019. This condition is a normal variant seen in African-Americans.   B. In September 2020 her WBC was higher than it had been in the past year. These relatively low counts did not appear to be due to MTZ treatment. Her WBC count was lower in July 2021, many months after stopping methimazole.  Her neutrophil count and PMN count in October were below the reference range again and were a bit lower.  7. Elevated transaminase:   A. Her AST and ALT were both elevated in August 2020. Although I increased her MTZ doses in August, her AST had normalized in September. His ALT in September was still elevated, but much lower than in August. Her LFTs in March, July, and October 2021 were normal.   B. The fact that her LFTs improved on higher doses of MTZ  indicates that the elevations in LFTs were due to her hyperthyroidism, not due to her MTZ.   C. LFTs were still normal in December 2022 and April 2023.  8. 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   9. Pallor: We need to check her CBC and iron today.  10. Noncompliance: Corey Flooraomi did not cooperate very well with taking MTZ in the past. She says she is doing a better job now of taking thyroid hormone . 11. Gender dysphoria: Corey Flooraomi is in counseling. She is not interested in any hormonal or surgical treatments at this time.   PLAN: ***  1. Diagnostic:  *** 2. Therapeutic: Continue the levothyroxine dose of 175 mcg/day for 6 days each week, but 1/2 tablet on the seventh days.    3. Patient education: We discussed all of the above at great length.  4. Follow-up: 4 months with Dr. Vanessa DurhamBadik    Level of Service: This visit lasted in excess of 65 minutes. More than 50% of the visit was devoted to counseling [name].   Dessa PhiJennifer Kobi Mario, MD ***

## 2022-05-23 NOTE — Patient Instructions (Signed)
[email or web address]

## 2022-06-04 ENCOUNTER — Ambulatory Visit (INDEPENDENT_AMBULATORY_CARE_PROVIDER_SITE_OTHER): Payer: Self-pay | Admitting: Pediatric Endocrinology

## 2022-07-10 ENCOUNTER — Encounter (INDEPENDENT_AMBULATORY_CARE_PROVIDER_SITE_OTHER): Payer: Self-pay | Admitting: Pediatric Endocrinology

## 2022-07-10 ENCOUNTER — Ambulatory Visit (INDEPENDENT_AMBULATORY_CARE_PROVIDER_SITE_OTHER): Payer: BC Managed Care – PPO | Admitting: Pediatric Endocrinology

## 2022-07-10 VITALS — BP 110/70 | HR 60 | Ht 74.06 in | Wt 159.4 lb

## 2022-07-10 DIAGNOSIS — F649 Gender identity disorder, unspecified: Secondary | ICD-10-CM

## 2022-07-10 DIAGNOSIS — E890 Postprocedural hypothyroidism: Secondary | ICD-10-CM | POA: Diagnosis not present

## 2022-07-10 MED ORDER — ESTRADIOL 0.025 MG/24HR TD PTWK: 0.0250 mg | MEDICATED_PATCH | TRANSDERMAL | 5 refills | Status: DC

## 2022-07-10 MED ORDER — MEDROXYPROGESTERONE ACETATE 150 MG/ML IM SUSP
150.0000 mg | Freq: Once | INTRAMUSCULAR | Status: AC
  Administered 2022-07-10: 150 mg via INTRAMUSCULAR

## 2022-07-10 MED ORDER — SPIRONOLACTONE 50 MG PO TABS: 50.0000 mg | ORAL_TABLET | Freq: Every day | ORAL | 1 refills | Status: DC

## 2022-07-10 NOTE — Progress Notes (Signed)
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 Pronouns are "she, her, and hers".   HISTORY OF PRESENT ILLNESS:   Corey Charles is a 22 y.o. African-American young woman (71 male at birth - [birth year]).  Corey Charles was unaccompanied.  1. Corey Charles's  initial pediatric endocrine evaluation occurred on 04/18/18. She went to a neurologist at [facility] in the Spring of 2018 for evaluation of a tremor. Her thyroid tests were hyperthyroid. Corey Charles was also having a fast heart rate, insomnia early awakening, and weight loss associated with eating less. She was also somewhat more irritable. She was anxious and also had difficulty with concentrating and thinking. TSH was 0.01, free T4 3.7 (ref 0.8-1.4), free T3 17.5 (ref 3.0-4.7), TSI 279, TPO antibody >900, and thyroglobulin antibody <1.  Corey Charles had a total thyroidectomy on 06/01/19. .  2. Corey Charles's last Pediatric [facility]ists visit occurred on 05/23/22. In the interim she has been doing well.     Gender Dysphoria  At her last visit we discussed gender dysphoria and options for gender affirming hormone therapy. Corey Charles had many questions and wound up taking the consent forms home to review. She feels ready to sign consent today. She is excited to start this next step in her journey.   She is working with [facility] for therapy. Her therapist is on board with her transitioning.  They are planning to meet more frequently during this transition period.   Post Surgical Hypothyroidism - She is taking 175 mcg of levothyroxine. Since her last visit she has been taking it every day.    She is taking classes at [facility].       ----------------------------------- Previous  History  She is interested in blockers if they are covered by her insurance. She is not interested in preservation of sexual function or fertility. She is hoping to have "bottom surgery" in the future.   Corey Charles says that she fully realized that she was male when she was about 61. When she realized it she also realized that she had "always felt that way". She remembered behaviors from when she was a child that she had pushed to the back of her mind thinking that she was just an "effeminate gay guy".  She remembers that when she would pretend to be an actor from a show she always picked male characters. She says that her father expected her to have pretty rigid gender toys/clothes. She did not have dolls or shoes or anything fancy but she would try to "dress up" using clothing anyway. She always wanted to be "a real woman". She has always wanted longer hair- but her parents always cut her hair. Because her hair was "4C hair" it was always really difficult to manage. Once she got older and could manage her hair she started to grow it out. She says that now that her hair is shoulder length she feels "so much better". She felt that her hair never matched her inner image of what she should look like. She has had an uncomfortabl-ity with her body. She has desired to look different in the way that she wanted her body to look more feminine with softer appeal. She also has concerns about her jaw line, her height, and her voice  tone. She hated looking at video of herself walking/talking/carrying herself. She felt that she always projected too masculine.   Once she got to middle/high school she learned about the [employer] and became much more comfortable with herself. She felt that she would have liked to have been born a girl but did not self identify as male. She thinks that this was internalized fear based on messaging she was getting from her dad about what it meant to be a man.   She came out as gay at  age 83 (2020). Her mom took it "pretty fine" but also said that she thought it was "just a phase" and "we all go through that".  She did not intentionally disclose to dad but he let her know that he had heard and that he would always support her and she would always be his son. However, after her parents separated and she was staying with him during the Covid Pandemic he started into a lot of anti-feminine talk and "I know you're gay BUT...". She has told him that she is transgender but he has never responded. She lives full time with her mom now. She does have some interactions with her dad but they do not talk about her gender.   She denies every trying to hurt herself related to dysphoria.   She "came out" to everyone as trans in 2023 through a very long text message with a full explanation of who she is. She says that her mom was supportive. Mom is usually pretty good but she sometimes slips up. She is currently getting it wrong more often than she gets it right. Corey Charles does not think it's intentional but she will dead-name her and use "he/him" pronouns. Corey Charles says that she does not correct her mom. She tries not to feel defensive about it. She doesn't want to have to correct her mom every time she opens her mouth.     3. Pertinent Review of Systems:  Constitutional: Corey Charles feels "good". Eyes: Vision seems to be good with her glasses. There are no recognized eye problems. Neck: No complaints of soreness in her anterior neck adjacent to the surgical scar.    Heart: Heart rate increases with exercise or other physical activity. She has no complaints of palpitations, irregular heart beats, chest pain, or chest pressure.   Gastrointestinal: Bowel movents seem normal. She has no complaints of excessive hunger, acid reflux, upset stomach, stomach aches or pains, diarrhea, or constipation.  Lungs: No asthma or wheezing Hands: She still has some tremor.   Legs: Muscle mass and strength seem pretty normal.  There are no complaints of numbness, tingling, burning, or pain. No edema is noted. She can go up and down stairs well.  Feet: There are no complaints of numbness, tingling, burning, or pain. No edema is noted. Neurologic: There are no recognized problems with muscle movement and strength, sensation, or coordination.   PAST MEDICAL, FAMILY, AND SOCIAL HISTORY  Past Medical History:  Diagnosis Date   Eczema    Graves disease    Hashimoto's disease     Family History  Problem Relation Age of Onset   Post-traumatic stress disorder Father    Hypertension Father    Hyperlipidemia Father    Hypertension Maternal Grandmother    Lupus Paternal Grandfather      Current Outpatient Medications:    estradiol (CLIMARA - DOSED IN MG/24 HR) 0.025 mg/24hr patch, Place 1 patch (0.025 mg total) onto the skin once a week.,  Disp: 4 patch, Rfl: 5   levothyroxine (SYNTHROID) 175 MCG tablet, TAKE 1 TABLET BY MOUTH DAILY FOR 6 DAYS A WEEK, TAKE 1/2 TABLET ON SEVENTH DAY, Disp: 90 tablet, Rfl: 3   Multiple Vitamins-Minerals (MULTIVITAMIN ADULTS) TABS, Take by mouth., Disp: , Rfl:    spironolactone (ALDACTONE) 50 MG tablet, Take 1 tablet (50 mg total) by mouth daily., Disp: 90 tablet, Rfl: 1   acetaminophen (TYLENOL) 500 MG tablet, Take 2 tablets (1,000 mg total) by mouth every 6 (six) hours. (Patient not taking: Reported on 03/23/2020), Disp: 30 tablet, Rfl: 0   Calcium-Phosphorus-Vitamin D (CALCIUM GUMMIES PO), Take 4 tablets by mouth daily. (Patient not taking: Reported on 02/12/2022), Disp: , Rfl:    calcium-vitamin D (OSCAL 500/200 D-3) 500-200 MG-UNIT tablet, Take 1 tablet by mouth 3 (three) times daily for 14 days., Disp: 42 tablet, Rfl: 0   cetirizine (ZYRTEC) 10 MG chewable tablet, Chew 10 mg by mouth daily as needed (allergies.). (Patient not taking: Reported on 02/12/2022), Disp: , Rfl:    FLUoxetine (PROZAC) 20 MG capsule, Take 20 mg by mouth daily.  (Patient not taking: Reported on 03/20/2021), Disp:  , Rfl:    HYDROCORTISONE EX, Apply 1 application. topically 4 (four) times daily as needed (eczema). (Patient not taking: Reported on 02/12/2022), Disp: , Rfl:    hydrOXYzine (VISTARIL) 25 MG capsule, Take 25 mg by mouth at bedtime. (Patient not taking: Reported on 02/12/2022), Disp: , Rfl:    Lactobacillus Rhamnosus, GG, (CULTURELLE) CAPS, Take by mouth. (Patient not taking: Reported on 02/12/2022), Disp: , Rfl:    Melatonin 3 MG TABS, Take 3 mg by mouth at bedtime as needed (sleep).  (Patient not taking: Reported on 12/22/2019), Disp: , Rfl:    polyethylene glycol powder (GLYCOLAX/MIRALAX) 17 GM/SCOOP powder, Take 17 g by mouth daily as needed (constipation). (Patient not taking: Reported on 02/12/2022), Disp: , Rfl:    Probiotic Product (PROBIOTIC PO), Take 1 capsule by mouth daily as needed (digestive regularity). (Patient not taking: Reported on 12/22/2019), Disp: , Rfl:    Skin Protectants, Misc. (EUCERIN) cream, Apply 1 application topically 4 (four) times daily as needed (eczema).  (Patient not taking: Reported on 03/23/2020), Disp: , Rfl:    tretinoin (RETIN-A) 0.05 % cream, , Disp: , Rfl:   Current Facility-Administered Medications:    medroxyPROGESTERone (DEPO-PROVERA) injection 150 mg, 150 mg, Intramuscular, Once, Lelon Huh, MD  Allergies as of 07/10/2022   (No Known Allergies)     reports that she has never smoked. She has been exposed to tobacco smoke. She has never used smokeless tobacco. She reports that she does not drink alcohol and does not use drugs. Pediatric History  Patient Parents   Durward Mallard (Mother)   Other Topics Concern   Not on file  Social History Narrative   Lives at with mom, brother, and dad.    She graduated at [employer]. 22-23,   Currently Freshman at [employer] doing Jamelle Haring right now 23-24 school year.   She enjoys acting, walking, and hanging out with friends.     1. School and Family: She graduated from high school  in May 2023. She is working and going to school at [employer]. She lives with her mom and brother. They are both supportive of her transition.  2. Activities: she wants to go into interior design.  Works at [employer] and [employer].  3. Primary Care Provider: Jaci Standard, MD  REVIEW OF SYSTEMS:  There are no other significant problems involving Reginald's other body systems.    Objective:  Objective  Vital Signs:   BP 110/70 (BP Location: Left Arm, Patient Position: Sitting, Cuff Size: Large)   Pulse 60   Ht 6' 2.06" (1.881 m)   Wt 159 lb 6.4 oz (72.3 kg)   BMI 20.44 kg/m    Ht Readings from Last 3 Encounters:  07/10/22 6' 2.06" (1.881 m) (95 %, Z= 1.62)*  05/23/22 6' 1.98" (1.879 m) (94 %, Z= 1.60)*  02/12/22 6' 2.41" (1.89 m) (96 %, Z= 1.77)*   * Growth percentiles are based on CDC (Boys, 2-20 Years) data.   Wt Readings from Last 3 Encounters:  07/10/22 159 lb 6.4 oz (72.3 kg) (60 %, Z= 0.26)*  05/23/22 158 lb (71.7 kg) (59 %, Z= 0.23)*  02/12/22 163 lb 6.4 oz (74.1 kg) (68 %, Z= 0.47)*   * Growth percentiles are based on CDC (Boys, 2-20 Years) data.   HC Readings from Last 3 Encounters:  No data found for [facility]   Body surface area is 1.94 meters squared. 95 %ile (Z= 1.62) based on CDC (Boys, 2-20 Years) Stature-for-age data based on Stature recorded on 07/10/2022. 60 %ile (Z= 0.26) based on CDC (Boys, 2-20 Years) weight-for-age data using vitals from 07/10/2022.  PHYSICAL EXAM:   Physical Exam Vitals reviewed.  Constitutional:      Appearance: Normal appearance.     Comments: Presentation is male  HENT:     Head: Normocephalic.     Right Ear: External ear normal.     Left Ear: External ear normal.     Mouth/Throat:     Mouth: Mucous membranes are moist.  Eyes:     Extraocular Movements: Extraocular movements intact.  Cardiovascular:     Rate and Rhythm: Normal rate and regular rhythm.     Pulses: Normal pulses.  Pulmonary:     Effort: Pulmonary  effort is normal.     Breath sounds: Normal breath sounds.  Abdominal:     Palpations: Abdomen is soft.  Genitourinary:    Comments: Testicular volume about 20 cc BL. Texture is smooth and slightly soft.  Musculoskeletal:        General: Normal range of motion.     Cervical back: Normal range of motion.  Skin:    General: Skin is warm and dry.     Capillary Refill: Capillary refill takes less than 2 seconds.  Neurological:     General: No focal deficit present.     Mental Status: She is alert.  Psychiatric:        Mood and Affect: Mood normal.        Behavior: Behavior normal.    LAB DATA:  No results found for this or any previous visit (from the past 672 hour(s)).   Lab Results  Component Value Date   TSH 62.70 (H) 02/12/2022   TSH 4.16 06/16/2021   TSH >150.00 (H) 03/20/2021   TSH 104.379 (H) 11/21/2020   Lab Results  Component Value Date   FREET4 1.0 02/12/2022   FREET4 1.9 (H) 06/16/2021   FREET4 0.5 (L) 03/20/2021   FREET4 0.74 11/21/2020      Assessment and Plan:  Assessment  ASSESSMENT: Kohner "Corey Charles" is a 22 y.o. trans male with history of post surgical hypothyroidism (following treatment for Grave's Disease) and gender dysphoria  1) Post surgical hypothyroidism - Currently on 175 mcg daily - Has been working on taking dose consistently - Clinically euthyroid -  will get labs today with baseline gender labs  2) Gender dysphoria - Is now ready to start Gender Affirming Hormone Therapy - Reviewed options today and completed consent forms  - Will give Depo Provera in clinic today - Prescription for Climara and Spironolactone to pharmacy  PLAN:   1. Diagnostic:   Lab Orders         TSH         T4, free         Comprehensive metabolic panel         CBC         Estradiol         FSH/LH         Testosterone, Total, LC/MS/MS         Lipid panel         Prolactin      2. Therapeutic:  Patient Instructions  Depo Provera today. Will plan to give  every 3 months with clinic visits.   Start Climara patches. This is a 25 mcg patch. Place on skin on buttocks, inner thigh, or abdomen. Replace one a week. Can cover with a tegaderm or bandaid if needed to keep it on the full week.   Start Spironolactone (Aldactone) 50 mg weekly. DRINK WATER!!  Continue Levothyroxine 175 mcg daily.    3. Patient education: Discussion as above  4. Follow-up: Return in about 3 months (around 10/09/2022).     Level of Service: >40 minutes spent today reviewing the medical chart, counseling the patient/family, and documenting today's encounter.     Lelon Huh, MD

## 2022-07-10 NOTE — Patient Instructions (Signed)
Depo Provera today. Will plan to give every 3 months with clinic visits.   Start Climara patches. This is a 25 mcg patch. Place on skin on buttocks, inner thigh, or abdomen. Replace one a week. Can cover with a tegaderm or bandaid if needed to keep it on the full week.   Start Spironolactone (Aldactone) 50 mg weekly. DRINK WATER!!  Continue Levothyroxine 175 mcg daily.

## 2022-07-10 NOTE — Progress Notes (Signed)
Name of Medication: MedroxyProgesterone Acetate    NDC number: 00712-197-58   Lot Number:  [id number]    Expiration Date:  10/16/2023  Who supplied the medication?  Office supplied   Who administered the injection? Roxy Horseman, CMA, AAMA   Administration Site:  Left Deltoid    Patient supplied: No    Was the patient observed for 10-15 minutes after injection was given? Yes If not, why?      Was there an adverse reaction after giving medication? No If yes, what reaction?

## 2022-07-15 ENCOUNTER — Other Ambulatory Visit (INDEPENDENT_AMBULATORY_CARE_PROVIDER_SITE_OTHER): Payer: Self-pay | Admitting: Pediatric Endocrinology

## 2022-07-15 LAB — T4, FREE: Free T4: 1.8 ng/dL — ABNORMAL HIGH (ref 0.8–1.4)

## 2022-07-15 LAB — COMPREHENSIVE METABOLIC PANEL
AG Ratio: 1.9 (calc) (ref 1.0–2.5)
ALT: 12 U/L (ref 8–46)
AST: 15 U/L (ref 12–32)
Albumin: 5 g/dL (ref 3.6–5.1)
Alkaline phosphatase (APISO): 54 U/L (ref 46–169)
BUN: 9 mg/dL (ref 7–20)
CO2: 24 mmol/L (ref 20–32)
Calcium: 10 mg/dL (ref 8.9–10.4)
Chloride: 105 mmol/L (ref 98–110)
Creat: 0.95 mg/dL (ref 0.60–1.24)
Globulin: 2.7 g/dL (calc) (ref 2.1–3.5)
Glucose, Bld: 75 mg/dL (ref 65–139)
Potassium: 3.7 mmol/L — ABNORMAL LOW (ref 3.8–5.1)
Sodium: 141 mmol/L (ref 135–146)
Total Bilirubin: 0.6 mg/dL (ref 0.2–1.1)
Total Protein: 7.7 g/dL (ref 6.3–8.2)

## 2022-07-15 LAB — CBC
HCT: 43.4 % (ref 38.5–50.0)
Hemoglobin: 15.1 g/dL (ref 13.2–17.1)
MCH: 32.8 pg (ref 27.0–33.0)
MCHC: 34.8 g/dL (ref 32.0–36.0)
MCV: 94.3 fL (ref 80.0–100.0)
MPV: 10.4 fL (ref 7.5–12.5)
Platelets: 268 10*3/uL (ref 140–400)
RBC: 4.6 10*6/uL (ref 4.20–5.80)
RDW: 11.7 % (ref 11.0–15.0)
WBC: 3.3 10*3/uL — ABNORMAL LOW (ref 3.8–10.8)

## 2022-07-15 LAB — LIPID PANEL
Cholesterol: 159 mg/dL (ref <170)
HDL: 53 mg/dL (ref >45)
LDL Cholesterol (Calc): 91 mg/dL (calc) (ref <110)
Non-HDL Cholesterol (Calc): 106 mg/dL (calc) (ref <120)
Total CHOL/HDL Ratio: 3 (calc) (ref <5.0)
Triglycerides: 63 mg/dL (ref <90)

## 2022-07-15 LAB — TSH: TSH: 0.14 mIU/L — ABNORMAL LOW (ref 0.50–4.30)

## 2022-07-15 LAB — PROLACTIN: Prolactin: 9.6 ng/mL (ref 2.0–18.0)

## 2022-07-15 LAB — ESTRADIOL: Estradiol: 31 pg/mL (ref <=39)

## 2022-07-15 LAB — TESTOSTERONE, TOTAL, LC/MS/MS: Testosterone, Total, LC-MS-MS: 731 ng/dL (ref 250–1100)

## 2022-07-15 LAB — FSH/LH
FSH: 3.6 m[IU]/mL (ref 1.4–12.8)
LH: 6 m[IU]/mL (ref 1.5–9.3)

## 2022-10-16 ENCOUNTER — Encounter (INDEPENDENT_AMBULATORY_CARE_PROVIDER_SITE_OTHER): Payer: Self-pay | Admitting: Pediatric Endocrinology

## 2022-10-16 ENCOUNTER — Ambulatory Visit (INDEPENDENT_AMBULATORY_CARE_PROVIDER_SITE_OTHER): Payer: BC Managed Care – PPO | Admitting: Pediatric Endocrinology

## 2022-10-16 VITALS — BP 108/64 | HR 74 | Wt 169.0 lb

## 2022-10-16 DIAGNOSIS — E890 Postprocedural hypothyroidism: Secondary | ICD-10-CM | POA: Diagnosis not present

## 2022-10-16 DIAGNOSIS — F649 Gender identity disorder, unspecified: Secondary | ICD-10-CM | POA: Diagnosis not present

## 2022-10-16 MED ORDER — ESTRADIOL 0.025 MG/24HR TD PTWK: 0.0250 mg | MEDICATED_PATCH | TRANSDERMAL | 1 refills | Status: DC

## 2022-10-16 NOTE — Progress Notes (Signed)
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 Pronouns are "she, her, and hers".   HISTORY OF PRESENT ILLNESS:   Corey Charles is a 22 y.o. African-American young woman (Assigned male at birth - AMAB).  Corey Charles was unaccompanied.  1. Corey Charles's  initial pediatric endocrine evaluation occurred on 04/18/18. She went to a neurologist at [facility] in the Spring of 2018 for evaluation of a tremor. Her thyroid tests were hyperthyroid. Corey Charles was also having a fast heart rate, insomnia early awakening, and weight loss associated with eating less. She was also somewhat more irritable. She was anxious and also had difficulty with concentrating and thinking. TSH was 0.01, free T4 3.7 (ref 0.8-1.4), free T3 17.5 (ref 3.0-4.7), TSI 279, TPO antibody >900, and thyroglobulin antibody <1.  Corey Charles had a total thyroidectomy on 06/01/19. .  2. Corey Charles's last Pediatric Specialists visit occurred on 07/10/22. In the interim she has been doing well.     Gender Dysphoria  At her last visit we started GAHT with depo provera, transdermal estrogen, and Spironolactone.   She reports that she feels really good now. At first she noticed that she was very emotional with a shorter fuse than she was accustomed to. She feels that she has been adjusting to her new normal.   She has noted that her skin is started to feel softer. She has also noticed a decrease in her libido.   She has noticed that she is no longer having morning arousals. She feels that this is a "great" thing.   She does not currently have an intimate partner.   She is still working with Zane Herald for therapy. She has really enjoyed having her as a sounding board during her  transition.   Things with mom are ok- she is trying to be supportive/helpful but Corey Charles doesn't really want to be around her- not because she is being bothersome but because she feels ready to fly the nest.   No headaches or vision changes.    Post Surgical Hypothyroidism - She is taking 175 mcg of levothyroxine. Since her last visit she has been taking it every day. After her last visit we decreased one dose a week to a half tab.    She is taking classes at [employer]. She is finishing in a couple weeks. 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        ----------------------------------- Previous History  She is interested in blockers if they are covered by her insurance. She is not interested in preservation of sexual function or fertility. She is hoping to have "bottom surgery" in the future.   Corey Charles says that she fully realized that she was male when she was about 63. When she realized it she also realized that she had "always felt that way". She remembered behaviors from when she was a child that she had pushed to the back of her mind thinking that she was just an "effeminate gay guy".  She remembers that when she would pretend to be an actor from a show she always picked male characters. She says that her father expected her to have pretty rigid gender toys/clothes. She did not have dolls or shoes or anything fancy  but she would try to "dress up" using clothing anyway. She always wanted to be "a real woman". She has always wanted longer hair- but her parents always cut her hair. Because her hair was "4C hair" it was always really difficult to manage. Once she got older and could manage her hair she started to grow it out. She says that now that her hair is shoulder length she feels "so much better". She felt that her hair never matched her inner image of what she should look like. She has had an uncomfortabl-ity with her body. She has desired to look  different in the way that she wanted her body to look more feminine with softer appeal. She also has concerns about her jaw line, her height, and her voice tone. She hated looking at video of herself walking/talking/carrying herself. She felt that she always projected too masculine.   Once she got to middle/high school she learned about the [employer] and became much more comfortable with herself. She felt that she would have liked to have been born a girl but did not self identify as male. She thinks that this was internalized fear based on messaging she was getting from her dad about what it meant to be a man.   She came out as gay at age 18 (2020). Her mom took it "pretty fine" but also said that she thought it was "just a phase" and "we all go through that".  She did not intentionally disclose to dad but he let her know that he had heard and that he would always support her and she would always be his son. However, after her parents separated and she was staying with him during the Covid Pandemic he started into a lot of anti-feminine talk and "I know you're gay BUT...". She has told him that she is transgender but he has never responded. She lives full time with her mom now. She does have some interactions with her dad but they do not talk about her gender.   She denies every trying to hurt herself related to dysphoria.   She "came out" to everyone as trans in 2023 through a very long text message with a full explanation of who she is. She says that her mom was supportive. Mom is usually pretty good but she sometimes slips up. She is currently getting it wrong more often than she gets it right. Corey Charles does not think it's intentional but she will dead-name her and use "he/him" pronouns. Corey Charles says that she does not correct her mom. She tries not to feel defensive about it. She doesn't want to have to correct her mom every time she opens her mouth.     3. Pertinent Review of Systems:   Constitutional: Corey Charles feels "pretty good". Eyes: Vision seems to be good with her glasses. There are no recognized eye problems. Neck: No complaints of soreness in her anterior neck adjacent to the surgical scar.    Heart: Heart rate increases with exercise or other physical activity. She has no complaints of palpitations, irregular heart beats, chest pain, or chest pressure.   Gastrointestinal: Bowel movents seem normal. She has no complaints of excessive hunger, acid reflux, upset stomach, stomach aches or pains, diarrhea, or constipation.  Lungs: No asthma or wheezing Hands: She still has some tremor.   Legs: Muscle mass and strength seem pretty normal. There are no complaints of numbness, tingling, burning, or pain. No edema is noted. She can go up and down  stairs well.  Feet: There are no complaints of numbness, tingling, burning, or pain. No edema is noted. Neurologic: There are no recognized problems with muscle movement and strength, sensation, or coordination.   PAST MEDICAL, FAMILY, AND SOCIAL HISTORY  Past Medical History:  Diagnosis Date   Eczema    Graves disease    Hashimoto's disease     Family History  Problem Relation Age of Onset   Post-traumatic stress disorder Father    Hypertension Father    Hyperlipidemia Father    Hypertension Maternal Grandmother    Lupus Paternal Grandfather      Current Outpatient Medications:    levothyroxine (SYNTHROID) 175 MCG tablet, TAKE 1 TABLET BY MOUTH DAILY FOR 6 DAYS A WEEK, TAKE 1/2 TABLET ON SEVENTH DAY, Disp: 90 tablet, Rfl: 3   spironolactone (ALDACTONE) 50 MG tablet, Take 1 tablet (50 mg total) by mouth daily., Disp: 90 tablet, Rfl: 1   acetaminophen (TYLENOL) 500 MG tablet, Take 2 tablets (1,000 mg total) by mouth every 6 (six) hours. (Patient not taking: Reported on 03/23/2020), Disp: 30 tablet, Rfl: 0   Calcium-Phosphorus-Vitamin D (CALCIUM GUMMIES PO), Take 4 tablets by mouth daily. (Patient not taking: Reported on  02/12/2022), Disp: , Rfl:    calcium-vitamin D (OSCAL 500/200 D-3) 500-200 MG-UNIT tablet, Take 1 tablet by mouth 3 (three) times daily for 14 days., Disp: 42 tablet, Rfl: 0   cetirizine (ZYRTEC) 10 MG chewable tablet, Chew 10 mg by mouth daily as needed (allergies.). (Patient not taking: Reported on 02/12/2022), Disp: , Rfl:    estradiol (CLIMARA - DOSED IN MG/24 HR) 0.025 mg/24hr patch, Place 1 patch (0.025 mg total) onto the skin once a week., Disp: 12 patch, Rfl: 1   FLUoxetine (PROZAC) 20 MG capsule, Take 20 mg by mouth daily.  (Patient not taking: Reported on 03/20/2021), Disp: , Rfl:    HYDROCORTISONE EX, Apply 1 application. topically 4 (four) times daily as needed (eczema). (Patient not taking: Reported on 02/12/2022), Disp: , Rfl:    hydrOXYzine (VISTARIL) 25 MG capsule, Take 25 mg by mouth at bedtime. (Patient not taking: Reported on 02/12/2022), Disp: , Rfl:    Lactobacillus Rhamnosus, GG, (CULTURELLE) CAPS, Take by mouth. (Patient not taking: Reported on 02/12/2022), Disp: , Rfl:    Melatonin 3 MG TABS, Take 3 mg by mouth at bedtime as needed (sleep).  (Patient not taking: Reported on 12/22/2019), Disp: , Rfl:    Multiple Vitamins-Minerals (MULTIVITAMIN ADULTS) TABS, Take by mouth. (Patient not taking: Reported on 10/16/2022), Disp: , Rfl:    polyethylene glycol powder (GLYCOLAX/MIRALAX) 17 GM/SCOOP powder, Take 17 g by mouth daily as needed (constipation). (Patient not taking: Reported on 02/12/2022), Disp: , Rfl:    Probiotic Product (PROBIOTIC PO), Take 1 capsule by mouth daily as needed (digestive regularity). (Patient not taking: Reported on 12/22/2019), Disp: , Rfl:    Skin Protectants, Misc. (EUCERIN) cream, Apply 1 application topically 4 (four) times daily as needed (eczema).  (Patient not taking: Reported on 03/23/2020), Disp: , Rfl:    tretinoin (RETIN-A) 0.05 % cream, , Disp: , Rfl:   Allergies as of 10/16/2022   (No Known Allergies)     reports that she has never smoked. She has been  exposed to tobacco smoke. She has never used smokeless tobacco. She reports that she does not drink alcohol and does not use drugs. Pediatric History  Patient Parents   Augustin Coupe (Mother)   Other Topics Concern   Not on file  Social History Narrative   Lives at with mom, brother, and dad.    She graduated at [employer]. 22-23,   Currently Freshman at [employer] doing Myrtha Mantis right now 23-24 school year.   She enjoys acting, walking, and hanging out with friends.     1. School and Family: She graduated from high school in May 2023. She is working and going to school at [employer]. She is planning to go to [employer] in the fall.  She lives with her mom and brother. They are both supportive of her transition.  2. Activities: she wants to go into interior design.  Works at [employer] and [employer].  3. Primary Care Provider: Renaee Munda, MD  REVIEW OF SYSTEMS: There are no other significant problems involving Duncan's other body systems.    Objective:  Objective  Vital Signs:   BP 108/64   Pulse 74   Wt 169 lb (76.7 kg)   BMI 21.67 kg/m   Blood pressure %iles are not available for patients who are 18 years or older.  Ht Readings from Last 3 Encounters:  07/10/22 6' 2.06" (1.881 m) (95 %, Z= 1.62)*  05/23/22 6' 1.98" (1.879 m) (94 %, Z= 1.60)*  02/12/22 6' 2.41" (1.89 m) (96 %, Z= 1.77)*   * Growth percentiles are based on CDC (Boys, 2-20 Years) data.   Wt Readings from Last 3 Encounters:  10/16/22 169 lb (76.7 kg) (72 %, Z= 0.57)*  07/10/22 159 lb 6.4 oz (72.3 kg) (60 %, Z= 0.26)*  05/23/22 158 lb (71.7 kg) (59 %, Z= 0.23)*   * Growth percentiles are based on CDC (Boys, 2-20 Years) data.   HC Readings from Last 3 Encounters:  No data found for [facility]   Body surface area is 2 meters squared. No height on file for this encounter. 72 %ile (Z= 0.57) based on CDC (Boys, 2-20 Years) weight-for-age data using vitals from  10/16/2022.  PHYSICAL EXAM:   Physical Exam Vitals reviewed.  Constitutional:      Appearance: Normal appearance.     Comments: Presentation is male  HENT:     Head: Normocephalic.     Right Ear: External ear normal.     Left Ear: External ear normal.     Mouth/Throat:     Mouth: Mucous membranes are moist.  Eyes:     Extraocular Movements: Extraocular movements intact.  Cardiovascular:     Rate and Rhythm: Normal rate and regular rhythm.     Pulses: Normal pulses.  Pulmonary:     Effort: Pulmonary effort is normal.     Breath sounds: Normal breath sounds.  Abdominal:     Palpations: Abdomen is soft.  Genitourinary:    Comments: Testicular volume about 20 cc BL. Texture is smooth and slightly soft.  Musculoskeletal:        General: Normal range of motion.     Cervical back: Normal range of motion.  Skin:    General: Skin is warm and dry.     Capillary Refill: Capillary refill takes less than 2 seconds.  Neurological:     General: No focal deficit present.     Mental Status: She is alert.  Psychiatric:        Mood and Affect: Mood normal.        Behavior: Behavior normal.    LAB DATA:  Results for orders placed or performed in visit on 10/16/22 (from the past 672 hour(s))  Comprehensive metabolic  panel   Collection Time: 10/16/22  2:45 PM  Result Value Ref Range   Glucose, Bld 85 65 - 139 mg/dL   BUN 11 7 - 20 mg/dL   Creat 5.78 4.69 - 6.29 mg/dL   BUN/Creatinine Ratio SEE NOTE: 6 - 22 (calc)   Sodium 140 135 - 146 mmol/L   Potassium 3.8 3.8 - 5.1 mmol/L   Chloride 106 98 - 110 mmol/L   CO2 25 20 - 32 mmol/L   Calcium 9.6 8.9 - 10.4 mg/dL   Total Protein 7.2 6.3 - 8.2 g/dL   Albumin 4.7 3.6 - 5.1 g/dL   Globulin 2.5 2.1 - 3.5 g/dL (calc)   AG Ratio 1.9 1.0 - 2.5 (calc)   Total Bilirubin 0.7 0.2 - 1.1 mg/dL   Alkaline phosphatase (APISO) 45 (L) 46 - 169 U/L   AST 12 12 - 32 U/L   ALT 13 8 - 46 U/L  Estradiol   Collection Time: 10/16/22  2:45 PM  Result  Value Ref Range   Estradiol 25 < OR = 39 pg/mL  FSH/LH   Collection Time: 10/16/22  2:45 PM  Result Value Ref Range   FSH 1.4 1.4 - 12.8 mIU/mL   LH 4.0 1.5 - 9.3 mIU/mL  Testosterone, Total, LC/MS/MS   Collection Time: 10/16/22  2:45 PM  Result Value Ref Range   Testosterone, Total, LC-MS-MS 189 (L) 250 - 1,100 ng/dL  CBC   Collection Time: 10/16/22  2:45 PM  Result Value Ref Range   WBC 3.4 (L) 3.8 - 10.8 Thousand/uL   RBC 4.25 4.20 - 5.80 Million/uL   Hemoglobin 14.3 13.2 - 17.1 g/dL   HCT 52.8 41.3 - 24.4 %   MCV 96.0 80.0 - 100.0 fL   MCH 33.6 (H) 27.0 - 33.0 pg   MCHC 35.0 32.0 - 36.0 g/dL   RDW 01.0 27.2 - 53.6 %   Platelets 238 140 - 400 Thousand/uL   MPV 10.2 7.5 - 12.5 fL     Lab Results  Component Value Date   TSH 0.14 (L) 07/10/2022   TSH 62.70 (H) 02/12/2022   TSH 4.16 06/16/2021   TSH >150.00 (H) 03/20/2021   Lab Results  Component Value Date   FREET4 1.8 (H) 07/10/2022   FREET4 1.0 02/12/2022   FREET4 1.9 (H) 06/16/2021   FREET4 0.5 (L) 03/20/2021      Assessment and Plan:  Assessment  ASSESSMENT: Corey Charles "Corey Charles" is a 22 y.o. trans male with history of post surgical hypothyroidism (following treatment for Grave's Disease) and gender dysphoria  1) Post surgical hypothyroidism - Currently on 175 mcg daily - Has been working on taking dose consistently - Clinically euthyroid - Last labs drawn 2 hours after dose given   2) Gender dysphoria - She is due for Depo Provera in clinic today - She continues on Climara and Spironolactone  - Labs today  PLAN:   1. Diagnostic:   Lab Orders         Comprehensive metabolic panel         Estradiol         FSH/LH         Testosterone, Total, LC/MS/MS         CBC       2. Therapeutic:  Climara 0.025 Spironolactone 50 mg Levothyroxine 175 mcg  There are no Patient Instructions on file for this visit.   3. Patient education: Discussion as above  4. Follow-up: Return in about 3 months (around  01/14/2023).     Level of Service: >30 minutes spent today reviewing the medical chart, counseling the patient/family, and documenting today's encounter.     Dessa Phi, MD

## 2022-10-17 LAB — COMPREHENSIVE METABOLIC PANEL
ALT: 13 U/L (ref 8–46)
CO2: 25 mmol/L (ref 20–32)
Creat: 0.91 mg/dL (ref 0.60–1.24)
Sodium: 140 mmol/L (ref 135–146)
Total Protein: 7.2 g/dL (ref 6.3–8.2)

## 2022-10-17 LAB — CBC
MCH: 33.6 pg — ABNORMAL HIGH (ref 27.0–33.0)
RBC: 4.25 10*6/uL (ref 4.20–5.80)

## 2022-10-17 LAB — FSH/LH: FSH: 1.4 m[IU]/mL (ref 1.4–12.8)

## 2022-10-20 LAB — CBC
HCT: 40.8 % (ref 38.5–50.0)
Hemoglobin: 14.3 g/dL (ref 13.2–17.1)
MCHC: 35 g/dL (ref 32.0–36.0)
MCV: 96 fL (ref 80.0–100.0)
MPV: 10.2 fL (ref 7.5–12.5)
Platelets: 238 10*3/uL (ref 140–400)
RDW: 11.7 % (ref 11.0–15.0)
WBC: 3.4 10*3/uL — ABNORMAL LOW (ref 3.8–10.8)

## 2022-10-20 LAB — COMPREHENSIVE METABOLIC PANEL
AG Ratio: 1.9 (calc) (ref 1.0–2.5)
AST: 12 U/L (ref 12–32)
Albumin: 4.7 g/dL (ref 3.6–5.1)
Alkaline phosphatase (APISO): 45 U/L — ABNORMAL LOW (ref 46–169)
BUN: 11 mg/dL (ref 7–20)
Calcium: 9.6 mg/dL (ref 8.9–10.4)
Chloride: 106 mmol/L (ref 98–110)
Globulin: 2.5 g/dL (calc) (ref 2.1–3.5)
Glucose, Bld: 85 mg/dL (ref 65–139)
Potassium: 3.8 mmol/L (ref 3.8–5.1)
Total Bilirubin: 0.7 mg/dL (ref 0.2–1.1)

## 2022-10-20 LAB — TESTOSTERONE, TOTAL, LC/MS/MS: Testosterone, Total, LC-MS-MS: 189 ng/dL — ABNORMAL LOW (ref 250–1100)

## 2022-10-20 LAB — FSH/LH: LH: 4 m[IU]/mL (ref 1.5–9.3)

## 2022-10-20 LAB — ESTRADIOL: Estradiol: 25 pg/mL (ref <=39)

## 2022-12-16 ENCOUNTER — Other Ambulatory Visit (INDEPENDENT_AMBULATORY_CARE_PROVIDER_SITE_OTHER): Payer: Self-pay | Admitting: Pediatric Endocrinology

## 2022-12-21 ENCOUNTER — Encounter (INDEPENDENT_AMBULATORY_CARE_PROVIDER_SITE_OTHER): Payer: Self-pay

## 2022-12-30 ENCOUNTER — Other Ambulatory Visit (INDEPENDENT_AMBULATORY_CARE_PROVIDER_SITE_OTHER): Payer: Self-pay | Admitting: Pediatric Endocrinology

## 2023-01-28 ENCOUNTER — Ambulatory Visit (INDEPENDENT_AMBULATORY_CARE_PROVIDER_SITE_OTHER): Payer: BC Managed Care – PPO | Admitting: Pediatric Endocrinology

## 2023-02-04 ENCOUNTER — Encounter (INDEPENDENT_AMBULATORY_CARE_PROVIDER_SITE_OTHER): Payer: Self-pay | Admitting: Pediatric Endocrinology

## 2023-02-04 ENCOUNTER — Ambulatory Visit (INDEPENDENT_AMBULATORY_CARE_PROVIDER_SITE_OTHER): Payer: BC Managed Care – PPO | Admitting: Pediatric Endocrinology

## 2023-02-04 VITALS — BP 108/72 | HR 86 | Ht 74.0 in | Wt 175.0 lb

## 2023-02-04 DIAGNOSIS — F649 Gender identity disorder, unspecified: Secondary | ICD-10-CM | POA: Diagnosis not present

## 2023-02-04 DIAGNOSIS — E890 Postprocedural hypothyroidism: Secondary | ICD-10-CM

## 2023-02-04 MED ORDER — MEDROXYPROGESTERONE ACETATE 150 MG/ML IM SUSY
150.0000 mg | PREFILLED_SYRINGE | Freq: Once | INTRAMUSCULAR | Status: AC
  Administered 2023-02-04: 150 mg via INTRAMUSCULAR

## 2023-02-04 MED ORDER — ESTRADIOL 0.0375 MG/24HR TD PTWK: 0.0375 mg | MEDICATED_PATCH | TRANSDERMAL | 12 refills | Status: DC

## 2023-02-04 NOTE — Progress Notes (Signed)
Name of Medication: MedroxyProgesterone 150 mg/ml  NDC number: 69629-528-41  Lot Number: [id number]  Expiration Date:05/2024  Who administered the injection?-Temperance Kelemen (CMA)  Administration Site: right deltoid   Patient supplied: No  Was the patient observed for 10-15 minutes after injection was given? Yes If not, why?  Was there an adverse reaction after giving medication? No If yes, what reaction?

## 2023-02-04 NOTE — Patient Instructions (Addendum)
Please call [facility] to schedule with Dr. Jennette Kettle for Gender Clinic. You can also schedule to establish care with a PCP there. They may be comfortable managing your thyroid hormones as well- since they are stable.   [address] [place], [place] [address] Phone: [phone]

## 2023-02-04 NOTE — Progress Notes (Signed)
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 Pronouns are "she, her, and hers".   HISTORY OF PRESENT ILLNESS:   Corey Charles is a 22 y.o. African-American young woman (Assigned male at birth - AMAB).  Corey Charles was unaccompanied.  1. Corey Charles's  initial pediatric endocrine evaluation occurred on 04/18/18. She went to a neurologist at [facility] in the Spring of 2018 for evaluation of a tremor. Her thyroid tests were hyperthyroid. Corey Charles was also having a fast heart rate, insomnia early awakening, and weight loss associated with eating less. She was also somewhat more irritable. She was anxious and also had difficulty with concentrating and thinking. TSH was 0.01, free T4 3.7 (ref 0.8-1.4), free T3 17.5 (ref 3.0-4.7), TSI 279, TPO antibody >900, and thyroglobulin antibody <1.  Corey Charles had a total thyroidectomy on 06/01/19. .  2. Corey Charles's last [facility] visit occurred on 4.30/24. In the interim she has been doing well.     Gender Dysphoria  She has continued on GAHT since 07/10/22, with depo provera, transdermal estrogen, and Spironolactone. She is due for Depot Provera. She missed a dose and feels that she "definitely needs it now".   She has seen an increase in frequency of morning and spontaneous arousals.  She feels that she has had more of a sex drive in general.   She is doing well with the transdermal estrogen patches. She has also continued on Spironolactone. She has noticed that she has more headaches recently.  No feelings of being light headed or dizzy.   She feels pretty emotionally stable.   She feels that her skin is softer and she has started to have breast tissue developing. Body hair has not decreased  in thickness or texture.   She does not currently have an intimate partner. She did have an intimate experience over the summer with a FTM. They did use protection.   She is still working with Zane Herald for therapy. She has really enjoyed having her as a sounding board during her transition.   Things with mom are good. Mom is working on pronouns/name but is not as consistent as Corey Charles would like her to be.   Corey Charles is starting at [employer].   She is not interested in voice therapy at this time.   Post Surgical Hypothyroidism - She is taking 175 mcg of levothyroxine. Since her last visit she had been taking it every day. She recently moved to start school and feels that everything got off schedule then. She is working to get back on track with her thyroid medicaiton now. She is meant to be taking 175 mcg x 6 days a week and a half tab on the 7th day.         ----------------------------------- Previous History  She is interested in blockers if they are covered by her insurance. She is not interested in preservation of sexual function or fertility. She is hoping to have "bottom surgery" in the future.   Corey Charles says that she fully realized that she was male when she was about 58. When she realized it she also realized that she had "always felt that way". She remembered behaviors from when she was a child that she had pushed to the back of her mind  thinking that she was just an "effeminate gay guy".  She remembers that when she would pretend to be an actor from a show she always picked male characters. She says that her father expected her to have pretty rigid gender toys/clothes. She did not have dolls or shoes or anything fancy but she would try to "dress up" using clothing anyway. She always wanted to be "a real woman". She has always wanted longer hair- but her parents always cut her hair. Because her hair was "4C hair" it was always really difficult to manage. Once she got older and  could manage her hair she started to grow it out. She says that now that her hair is shoulder length she feels "so much better". She felt that her hair never matched her inner image of what she should look like. She has had an uncomfortabl-ity with her body. She has desired to look different in the way that she wanted her body to look more feminine with softer appeal. She also has concerns about her jaw line, her height, and her voice tone. She hated looking at video of herself walking/talking/carrying herself. She felt that she always projected too masculine.   Once she got to middle/high school she learned about the [employer] and became much more comfortable with herself. She felt that she would have liked to have been born a girl but did not self identify as male. She thinks that this was internalized fear based on messaging she was getting from her dad about what it meant to be a man.   She came out as gay at age 55 (2020). Her mom took it "pretty fine" but also said that she thought it was "just a phase" and "we all go through that".  She did not intentionally disclose to dad but he let her know that he had heard and that he would always support her and she would always be his son. However, after her parents separated and she was staying with him during the Covid Pandemic he started into a lot of anti-feminine talk and "I know you're gay BUT...". She has told him that she is transgender but he has never responded. She lives full time with her mom now. She does have some interactions with her dad but they do not talk about her gender.   She denies every trying to hurt herself related to dysphoria.   She "came out" to everyone as trans in 2023 through a very long text message with a full explanation of who she is. She says that her mom was supportive. Mom is usually pretty good but she sometimes slips up. She is currently getting it wrong more often than she gets it right. Corey Charles does not think  it's intentional but she will dead-name her and use "he/him" pronouns. Corey Charles says that she does not correct her mom. She tries not to feel defensive about it. She doesn't want to have to correct her mom every time she opens her mouth.     3. Pertinent Review of Systems:  Constitutional: Corey Charles feels "pretty good". Eyes: Vision seems to be good with her glasses. There are no recognized eye problems. Neck: No complaints of soreness in her anterior neck adjacent to the surgical scar.    Heart: Heart rate increases with exercise or other physical activity. She has no complaints of palpitations, irregular heart beats, chest pain, or chest pressure.   Gastrointestinal: Bowel movents seem normal. She has no complaints of excessive hunger, acid  reflux, upset stomach, stomach aches or pains, diarrhea, or constipation.  Lungs: No asthma or wheezing Hands: She still has some tremor.   Legs: Muscle mass and strength seem pretty normal. There are no complaints of numbness, tingling, burning, or pain. No edema is noted. She can go up and down stairs well.  Feet: There are no complaints of numbness, tingling, burning, or pain. No edema is noted. Neurologic: There are no recognized problems with muscle movement and strength, sensation, or coordination.   PAST MEDICAL, FAMILY, AND SOCIAL HISTORY  Past Medical History:  Diagnosis Date   Eczema    Graves disease    Hashimoto's disease     Family History  Problem Relation Age of Onset   Post-traumatic stress disorder Father    Hypertension Father    Hyperlipidemia Father    Hypertension Maternal Grandmother    Lupus Paternal Grandfather      Current Outpatient Medications:    cetirizine (ZYRTEC) 10 MG chewable tablet, Chew 10 mg by mouth daily as needed (allergies.)., Disp: , Rfl:    estradiol (CLIMARA) 0.0375 mg/24hr patch, Place 1 patch (0.0375 mg total) onto the skin once a week., Disp: 4 patch, Rfl: 12   HYDROCORTISONE EX, Apply 1 application   topically 4 (four) times daily as needed (eczema)., Disp: , Rfl:    Lactobacillus Rhamnosus, GG, (CULTURELLE) CAPS, Take by mouth., Disp: , Rfl:    levothyroxine (SYNTHROID) 175 MCG tablet, TAKE 1 TABLET BY MOUTH DAILY FOR 6 DAYS A WEEK, TAKE 1/2 TABLET ON SEVENTH DAY, Disp: 90 tablet, Rfl: 3   Melatonin 3 MG TABS, Take 3 mg by mouth at bedtime as needed (sleep)., Disp: , Rfl:    spironolactone (ALDACTONE) 50 MG tablet, TAKE 1 TABLET(50 MG) BY MOUTH DAILY, Disp: 90 tablet, Rfl: 1   tretinoin (RETIN-A) 0.05 % cream, , Disp: , Rfl:    acetaminophen (TYLENOL) 500 MG tablet, Take 2 tablets (1,000 mg total) by mouth every 6 (six) hours. (Patient not taking: Reported on 02/04/2023), Disp: 30 tablet, Rfl: 0   Calcium-Phosphorus-Vitamin D (CALCIUM GUMMIES PO), Take 4 tablets by mouth daily. (Patient not taking: Reported on 02/04/2023), Disp: , Rfl:    calcium-vitamin D (OSCAL 500/200 D-3) 500-200 MG-UNIT tablet, Take 1 tablet by mouth 3 (three) times daily for 14 days., Disp: 42 tablet, Rfl: 0   FLUoxetine (PROZAC) 20 MG capsule, Take 20 mg by mouth daily.  (Patient not taking: Reported on 02/04/2023), Disp: , Rfl:    hydrOXYzine (VISTARIL) 25 MG capsule, Take 25 mg by mouth at bedtime. (Patient not taking: Reported on 02/04/2023), Disp: , Rfl:    Multiple Vitamins-Minerals (MULTIVITAMIN ADULTS) TABS, Take by mouth. (Patient not taking: Reported on 02/04/2023), Disp: , Rfl:    polyethylene glycol powder (GLYCOLAX/MIRALAX) 17 GM/SCOOP powder, Take 17 g by mouth daily as needed (constipation). (Patient not taking: Reported on 02/04/2023), Disp: , Rfl:    Probiotic Product (PROBIOTIC PO), Take 1 capsule by mouth daily as needed (digestive regularity). (Patient not taking: Reported on 02/04/2023), Disp: , Rfl:    Skin Protectants, Misc. (EUCERIN) cream, Apply 1 application topically 4 (four) times daily as needed (eczema).  (Patient not taking: Reported on 02/04/2023), Disp: , Rfl:   Allergies as of 02/04/2023   (No  Known Allergies)     reports that she has never smoked. She has been exposed to tobacco smoke. She has never used smokeless tobacco. She reports that she does not drink alcohol and does not use drugs. Pediatric  History  Patient Parents   Augustin Coupe (Mother)   Other Topics Concern   Not on file  Social History Narrative   Lives at with mom, brother, and dad.    She attend [employer] Currently Freshman at [employer] doing Gen Eds right now 24-25 school year.   She enjoys acting, walking, and hanging out with friends.     1. School and Family: She graduated from high school in May 2023. She is working and going to school at [employer]. She is planning to go to [employer] in the fall.  She lives with her mom and brother. They are both supportive of her transition.  2. Activities: she wants to go into interior design.  Works at [employer] and [employer].  3. Primary Care Provider: Renaee Munda, MD  REVIEW OF SYSTEMS: There are no other significant problems involving Demarrius's other body systems.    Objective:  Objective  Vital Signs:   BP 108/72   Pulse 86   Ht 6\' 2"  (1.88 m)   Wt 175 lb (79.4 kg)   BMI 22.47 kg/m   Blood pressure %iles are not available for patients who are 18 years or older.  Ht Readings from Last 3 Encounters:  02/04/23 6\' 2"  (1.88 m) (94%, Z= 1.58)*  07/10/22 6' 2.06" (1.881 m) (95%, Z= 1.62)*  05/23/22 6' 1.98" (1.879 m) (94%, Z= 1.60)*   * Growth percentiles are based on CDC (Boys, 2-20 Years) data.   Wt Readings from Last 3 Encounters:  02/04/23 175 lb (79.4 kg) (77%, Z= 0.73)*  10/16/22 169 lb (76.7 kg) (72%, Z= 0.57)*  07/10/22 159 lb 6.4 oz (72.3 kg) (60%, Z= 0.26)*   * Growth percentiles are based on CDC (Boys, 2-20 Years) data.   HC Readings from Last 3 Encounters:  No data found for [facility]   Body surface area is 2.04 meters squared. 94 %ile (Z= 1.58) based on CDC (Boys, 2-20 Years) Stature-for-age data based on Stature  recorded on 02/04/2023. 77 %ile (Z= 0.73) based on CDC (Boys, 2-20 Years) weight-for-age data using data from 02/04/2023.  PHYSICAL EXAM:   Physical Exam Vitals reviewed.  Constitutional:      Appearance: Normal appearance.     Comments: Presentation is male  HENT:     Head: Normocephalic.     Right Ear: External ear normal.     Left Ear: External ear normal.     Mouth/Throat:     Mouth: Mucous membranes are moist.  Eyes:     Extraocular Movements: Extraocular movements intact.  Cardiovascular:     Rate and Rhythm: Normal rate and regular rhythm.     Pulses: Normal pulses.  Pulmonary:     Effort: Pulmonary effort is normal.     Breath sounds: Normal breath sounds.  Abdominal:     Palpations: Abdomen is soft.  Genitourinary:    Comments: Testicular volume about 20 cc BL. Texture is smooth and slightly soft.  Musculoskeletal:        General: Normal range of motion.     Cervical back: Normal range of motion.  Skin:    General: Skin is warm and dry.     Capillary Refill: Capillary refill takes less than 2 seconds.  Neurological:     General: No focal deficit present.     Mental Status: She is alert.  Psychiatric:        Mood and Affect: Mood normal.  Behavior: Behavior normal.    LAB DATA:  No results found for this or any previous visit (from the past 672 hour(s)).    Lab Results  Component Value Date   TSH 0.14 (L) 07/10/2022   TSH 62.70 (H) 02/12/2022   TSH 4.16 06/16/2021   TSH >150.00 (H) 03/20/2021   Lab Results  Component Value Date   FREET4 1.8 (H) 07/10/2022   FREET4 1.0 02/12/2022   FREET4 1.9 (H) 06/16/2021   FREET4 0.5 (L) 03/20/2021      Assessment and Plan:  Assessment  ASSESSMENT: Jabre "Corey Charles" is a 22 y.o. trans male with history of post surgical hypothyroidism (following treatment for Grave's Disease) and gender dysphoria  1) Post surgical hypothyroidism - Currently on 175 mcg daily - Has been working on taking dose  consistently - Clinically euthyroid - Last labs drawn 2 hours after dose given   2) Gender dysphoria - She is due for Depo Provera in clinic today - She continues on Climara and Spironolactone  - Labs today  PLAN:   1. Diagnostic:   Lab Orders         CBC         Comprehensive metabolic panel         Estradiol         Testosterone, Total, LC/MS/MS        2. Therapeutic:  Climara 0.025 -> 0.0375 Spironolactone 50 mg Levothyroxine 175 mcg Depo provera in clinic today after lab draw.  Meds ordered this encounter  Medications   medroxyPROGESTERone Acetate SUSY 150 mg   estradiol (CLIMARA) 0.0375 mg/24hr patch    Sig: Place 1 patch (0.0375 mg total) onto the skin once a week.    Dispense:  4 patch    Refill:  12     Patient Instructions  Please call [facility] to schedule with Dr. Jennette Kettle for Gender Clinic. You can also schedule to establish care with a PCP there. They may be comfortable managing your thyroid hormones as well- since they are stable.   [address], [place], [place] [address] Phone: [phone]   3. Patient education: Discussion as above  4. Follow-up: Return for [facility] .     Level of Service: >40 minutes spent today reviewing the medical chart, counseling the patient/family, and documenting today's encounter.      Dessa Phi, MD

## 2023-02-07 LAB — COMPREHENSIVE METABOLIC PANEL
AG Ratio: 2 (calc) (ref 1.0–2.5)
ALT: 14 U/L (ref 8–46)
AST: 19 U/L (ref 12–32)
Albumin: 5 g/dL (ref 3.6–5.1)
Alkaline phosphatase (APISO): 49 U/L (ref 46–169)
BUN: 15 mg/dL (ref 7–20)
CO2: 24 mmol/L (ref 20–32)
Calcium: 9.8 mg/dL (ref 8.9–10.4)
Chloride: 102 mmol/L (ref 98–110)
Creat: 1.21 mg/dL (ref 0.60–1.24)
Globulin: 2.5 g/dL (calc) (ref 2.1–3.5)
Glucose, Bld: 76 mg/dL (ref 65–139)
Potassium: 4 mmol/L (ref 3.8–5.1)
Sodium: 139 mmol/L (ref 135–146)
Total Bilirubin: 0.7 mg/dL (ref 0.2–1.1)
Total Protein: 7.5 g/dL (ref 6.3–8.2)

## 2023-02-07 LAB — ESTRADIOL: Estradiol: 37 pg/mL (ref <=39)

## 2023-02-07 LAB — CBC
HCT: 44.7 % (ref 38.5–50.0)
Hemoglobin: 15.8 g/dL (ref 13.2–17.1)
MCH: 33.7 pg — ABNORMAL HIGH (ref 27.0–33.0)
MCHC: 35.3 g/dL (ref 32.0–36.0)
MCV: 95.3 fL (ref 80.0–100.0)
MPV: 10.8 fL (ref 7.5–12.5)
Platelets: 290 10*3/uL (ref 140–400)
RBC: 4.69 10*6/uL (ref 4.20–5.80)
RDW: 11.7 % (ref 11.0–15.0)
WBC: 3.4 10*3/uL — ABNORMAL LOW (ref 3.8–10.8)

## 2023-02-07 LAB — TESTOSTERONE, TOTAL, LC/MS/MS: Testosterone, Total, LC-MS-MS: 182 ng/dL — ABNORMAL LOW (ref 250–1100)

## 2023-07-02 ENCOUNTER — Other Ambulatory Visit (INDEPENDENT_AMBULATORY_CARE_PROVIDER_SITE_OTHER): Payer: Self-pay | Admitting: Pediatric Endocrinology

## 2023-07-02 NOTE — Telephone Encounter (Signed)
 Called to confirm if patient has established care for their thyroid , as we received a refill request for levothyroxine  and Dr. Dorrene is no longer at this practice and nothing seen in the chart. No answer. Voicemail full so unable to leave a message.  Attempted to call mom as well, as DPR allows, but went straight to voicemail and unable to leave a message.

## 2023-07-03 NOTE — Telephone Encounter (Signed)
 Attempted to contact patient to see if they have established care with an adult endocrine provider for their thyroid . No answer. Unable to leave a message.  Also called mom per DPR allows this. Straight to voicemail but unable to leave a message.

## 2023-11-13 ENCOUNTER — Ambulatory Visit: Admission: EM | Admit: 2023-11-13 | Discharge: 2023-11-13 | Disposition: A | Discharge status: Home / Self Care

## 2023-11-13 DIAGNOSIS — L723 Sebaceous cyst: Secondary | ICD-10-CM

## 2023-11-13 MED ORDER — DOXYCYCLINE HYCLATE 100 MG PO CAPS: 100.0000 mg | ORAL_CAPSULE | Freq: Two times a day (BID) | ORAL | 0 refills | Status: DC

## 2023-11-13 NOTE — ED Provider Notes (Signed)
 [facility]  Note:  This document was prepared using Conservation officer, historic buildings and may include unintentional dictation errors.  MRN: [id number] DOB: 2001/02/21  Subjective:   Corey Charles is a 23 y.o. adult presenting for swollen painful cyst under right axilla and to left lower abdomen x 3 days.  Patient reports the area is tender and swollen very sore to the touch.  Denies any drainage, no fever, no prior history of epidermoid cyst.  Patient not taking any over-the-counter medication to treat symptoms.  No current facility-administered medications for this encounter.  Current Outpatient Medications:    doxycycline (VIBRAMYCIN) 100 MG capsule, Take 1 capsule (100 mg total) by mouth 2 (two) times daily., Disp: 20 capsule, Rfl: 0   levothyroxine  (SYNTHROID ) 175 MCG tablet, TAKE 1 TABLET BY MOUTH DAILY FOR 6 DAYS A WEEK, TAKE 1/2 TABLET ON SEVENTH DAY, Disp: 90 tablet, Rfl: 3   sertraline (ZOLOFT) 50 MG tablet, Take 50 mg by mouth once daily, Disp: , Rfl:    acetaminophen  (TYLENOL ) 500 MG tablet, Take 2 tablets (1,000 mg total) by mouth every 6 (six) hours. (Patient not taking: Reported on 02/04/2023), Disp: 30 tablet, Rfl: 0   Calcium -Phosphorus-Vitamin D  (CALCIUM  GUMMIES PO), Take 4 tablets by mouth daily. (Patient not taking: Reported on 02/04/2023), Disp: , Rfl:    calcium -vitamin D  (OSCAL 500/200 D-3) 500-200 MG-UNIT tablet, Take 1 tablet by mouth 3 (three) times daily for 14 days., Disp: 42 tablet, Rfl: 0   cetirizine (ZYRTEC) 10 MG chewable tablet, Chew 10 mg by mouth daily as needed (allergies.)., Disp: , Rfl:    estradiol  (CLIMARA ) 0.0375 mg/24hr patch, Place 1 patch (0.0375 mg total) onto the skin once a week., Disp: 4 patch, Rfl: 12   FLUoxetine  (PROZAC ) 20 MG capsule, Take 20 mg by mouth daily.  (Patient not taking: Reported on 02/04/2023), Disp: , Rfl:    HYDROCORTISONE  EX, Apply 1 application  topically 4 (four) times daily as needed (eczema)., Disp: , Rfl:     hydrOXYzine  (VISTARIL ) 25 MG capsule, Take 25 mg by mouth at bedtime. (Patient not taking: Reported on 02/04/2023), Disp: , Rfl:    Lactobacillus Rhamnosus, GG, (CULTURELLE) CAPS, Take by mouth., Disp: , Rfl:    Melatonin 3 MG TABS, Take 3 mg by mouth at bedtime as needed (sleep)., Disp: , Rfl:    Multiple Vitamins-Minerals (MULTIVITAMIN ADULTS) TABS, Take by mouth. (Patient not taking: Reported on 02/04/2023), Disp: , Rfl:    polyethylene glycol powder (GLYCOLAX /MIRALAX ) 17 GM/SCOOP powder, Take 17 g by mouth daily as needed (constipation). (Patient not taking: Reported on 02/04/2023), Disp: , Rfl:    Probiotic Product (PROBIOTIC PO), Take 1 capsule by mouth daily as needed (digestive regularity). (Patient not taking: Reported on 02/04/2023), Disp: , Rfl:    Skin Protectants, Misc. (EUCERIN) cream, Apply 1 application topically 4 (four) times daily as needed (eczema).  (Patient not taking: Reported on 02/04/2023), Disp: , Rfl:    spironolactone  (ALDACTONE ) 50 MG tablet, TAKE 1 TABLET(50 MG) BY MOUTH DAILY, Disp: 90 tablet, Rfl: 1   tretinoin (RETIN-A) 0.05 % cream, , Disp: , Rfl:    No Known Allergies  Past Medical History:  Diagnosis Date   Eczema    Graves disease    Hashimoto's disease      Past Surgical History:  Procedure Laterality Date   ORCHIOPEXY N/A 09/01/2017   Procedure: SCROTAL EXPLORATION, RIGHT TESTICULAR DETORSION, BILATERAL ORCHIOPEXY;  Surgeon: Verlena Glenn, MD;  Location: [facility] OR;  Service:  Pediatrics;  Laterality: N/A;   PARATHYROIDECTOMY N/A 06/01/2019   Procedure: PARATHYROIDECTOMY AUTOTRANSPLANT;  Surgeon: Mercy Stall, MD;  Location: [facility] ORS;  Service: General;  Laterality: N/A;   REDUCTION OF TORSION OF TESTIS Bilateral    THYROIDECTOMY N/A 06/01/2019   Procedure: THYROIDECTOMY;  Surgeon: Mercy Stall, MD;  Location: [facility] ORS;  Service: General;  Laterality: N/A;    Family History  Problem Relation Age of Onset   Post-traumatic stress disorder Father     Hypertension Father    Hyperlipidemia Father    Hypertension Maternal Grandmother    Lupus Paternal Grandfather     Social History   Tobacco Use   Smoking status: Never    Passive exposure: Yes   Smokeless tobacco: Never   Tobacco comments:    family smokes outside  Vaping Use   Vaping status: Never Used  Substance Use Topics   Alcohol use: Never   Drug use: Never    ROS Refer to HPI for ROS details.  Objective:   Vitals: BP 106/67 (BP Location: Right Arm)   Pulse 65   Temp 98.5 F (36.9 C) (Oral)   Resp 16   Ht 6\' 2"  (1.88 m)   Wt 185 lb (83.9 kg)   SpO2 95%   BMI 23.75 kg/m   Physical Exam Vitals and nursing note reviewed.  Constitutional:      General: She is not in acute distress.    Appearance: Normal appearance. She is not ill-appearing.  HENT:     Head: Normocephalic.  Cardiovascular:     Rate and Rhythm: Normal rate.  Pulmonary:     Effort: Pulmonary effort is normal. No respiratory distress.  Skin:    General: Skin is warm and dry.     Capillary Refill: Capillary refill takes less than 2 seconds.     Findings: Erythema (Mild erythema and swelling to right lower quadrant abdomen, mild erythema, minimal swelling, no drainage, possibly folliculitis or sebaceous cyst.) and lesion (2 cm erythematous epidermoid cyst noted to right axilla, cyst is hardened, movable, no warmth, no drainage, increased pain with palpation.) present.  Neurological:     General: No focal deficit present.     Mental Status: She is alert and oriented to person, place, and time.  Psychiatric:        Mood and Affect: Mood normal.        Behavior: Behavior normal.     Procedures  No results found for this or any previous visit (from the past 24 hours).  Assessment and Plan :     Discharge Instructions       1. Sebaceous cyst of axilla (Primary) - doxycycline (VIBRAMYCIN) 100 MG capsule; Take 1 capsule (100 mg total) by mouth 2 (two) times daily.  Dispense: 20  capsule; Refill: 0 - If symptoms do not improve with antibiotic therapy return to urgent care or follow-up with your primary care provider for further evaluation and management. - If cyst continues to manifest you may require cyst removal either by dermatology or general surgery. -Continue to monitor symptoms for any change in severity if there is any escalation of current symptoms or development of new symptoms follow-up in ER for further evaluation and management.    Krishon Adkison [address]   Tarique Loveall, [place], [place] 11/13/23 1943

## 2023-11-13 NOTE — ED Triage Notes (Signed)
 Pt c/o boil in abdomen & under R axilla x3 days. States both are sore & tender. Denies any drainage.

## 2023-11-13 NOTE — Discharge Instructions (Addendum)
  1. Sebaceous cyst of axilla (Primary) - doxycycline (VIBRAMYCIN) 100 MG capsule; Take 1 capsule (100 mg total) by mouth 2 (two) times daily.  Dispense: 20 capsule; Refill: 0 - If symptoms do not improve with antibiotic therapy return to urgent care or follow-up with your primary care provider for further evaluation and management. - If cyst continues to manifest you may require cyst removal either by dermatology or general surgery. -Continue to monitor symptoms for any change in severity if there is any escalation of current symptoms or development of new symptoms follow-up in ER for further evaluation and management.

## 2024-02-28 ENCOUNTER — Telehealth (INDEPENDENT_AMBULATORY_CARE_PROVIDER_SITE_OTHER): Payer: Self-pay | Admitting: Pediatric Endocrinology

## 2024-02-28 NOTE — Telephone Encounter (Signed)
  Name of who is calling: Joseth   Caller's Relationship to Patient: self  Best contact number: [phone]  Provider they see: badik  Reason for call: Called to see since was a pt here if a referral can be put in to be able to go to an adult endo due to him not having a PCP?      PRESCRIPTION REFILL ONLY  Name of prescription:  Pharmacy:

## 2024-03-02 NOTE — Telephone Encounter (Signed)
 Called to relay information that our office cannot send a referral due to not being seen in over 1 year as well as being over the age of 62. No answer. Left HIPAA approved voicemail with information and to call the office back if any questions. Provided office phone number.

## 2024-05-27 ENCOUNTER — Encounter: Payer: Self-pay | Admitting: Family Medicine

## 2024-05-27 ENCOUNTER — Ambulatory Visit (INDEPENDENT_AMBULATORY_CARE_PROVIDER_SITE_OTHER): Admitting: Family Medicine

## 2024-05-27 VITALS — BP 100/69 | HR 54 | Temp 98.4°F | Ht 74.0 in | Wt 176.8 lb

## 2024-05-27 DIAGNOSIS — R748 Abnormal levels of other serum enzymes: Secondary | ICD-10-CM | POA: Diagnosis not present

## 2024-05-27 DIAGNOSIS — E063 Autoimmune thyroiditis: Secondary | ICD-10-CM | POA: Diagnosis not present

## 2024-05-27 DIAGNOSIS — Z9089 Acquired absence of other organs: Secondary | ICD-10-CM

## 2024-05-27 DIAGNOSIS — F411 Generalized anxiety disorder: Secondary | ICD-10-CM | POA: Insufficient documentation

## 2024-05-27 DIAGNOSIS — Z9889 Other specified postprocedural states: Secondary | ICD-10-CM

## 2024-05-27 DIAGNOSIS — F649 Gender identity disorder, unspecified: Secondary | ICD-10-CM | POA: Diagnosis not present

## 2024-05-27 DIAGNOSIS — Z789 Other specified health status: Secondary | ICD-10-CM | POA: Insufficient documentation

## 2024-05-27 DIAGNOSIS — F321 Major depressive disorder, single episode, moderate: Secondary | ICD-10-CM | POA: Diagnosis not present

## 2024-05-27 DIAGNOSIS — E890 Postprocedural hypothyroidism: Secondary | ICD-10-CM

## 2024-05-27 DIAGNOSIS — Z23 Encounter for immunization: Secondary | ICD-10-CM

## 2024-05-27 DIAGNOSIS — Z1159 Encounter for screening for other viral diseases: Secondary | ICD-10-CM | POA: Diagnosis not present

## 2024-05-27 MED ORDER — LEVOTHYROXINE SODIUM 175 MCG PO TABS: ORAL_TABLET | ORAL | 3 refills | Status: AC

## 2024-05-27 MED ORDER — SERTRALINE HCL 50 MG PO TABS: 50.0000 mg | ORAL_TABLET | Freq: Every day | ORAL | 1 refills | Status: AC

## 2024-05-27 MED ORDER — BUSPIRONE HCL 10 MG PO TABS: 10.0000 mg | ORAL_TABLET | Freq: Two times a day (BID) | ORAL | 2 refills | Status: AC

## 2024-05-27 NOTE — Assessment & Plan Note (Signed)
 Hx of thyroiditis  Referred to endocrinology

## 2024-05-27 NOTE — Progress Notes (Signed)
 New patient visit   Patient: Corey Charles   DOB: January 23, 2001   23 y.o. Adult  MRN: [id number] Visit Date: 05/27/2024  Today's healthcare provider: Rockie Agent, MD   Chief Complaint  Patient presents with   Establish Care    Patient is present to establish care with new PCP.  Diet is normal per patient. Not consistently exercising  Concerns with establishing PCP to monitor during transition, would like new referral to endocrinology     Subjective    Corey Charles is a 23 y.o. adult who presents today as a new patient to establish care.   HPI     Establish Care    Additional comments: Patient is present to establish care with new PCP.  Diet is normal per patient. Not consistently exercising  Concerns with establishing PCP to monitor during transition, would like new referral to endocrinology        Last edited by Cherry Chiquita HERO, CMA on 05/27/2024  9:12 AM.       Discussed the use of AI scribe software for clinical note transcription with the patient, who gave verbal consent to proceed.  History of Present Illness Corey Charles is a 23 year old male who presents for thyroid  level evaluation and endocrinology referral.  She is here to update her thyroid  levels. She has a history of thyroidectomy and Hashimoto's disease and has not been taking Synthroid  175 mcg as prescribed.  She has a history of anxiety and depression. She was previously on Zoloft for about one to two months but stopped due to insurance issues. Her mood is described as 'pretty neutral' but she experiences both anxiety and depressive episodes. She is not currently seeing a therapist or psychiatrist due to insurance changes and has not been taking Vistaril  25 mg.  She is undergoing male to male transitioning and uses a Climera patch. Her last endocrinologist increased her estrogen dosage, but she has not established care with a new endocrinologist after her previous one  moved.  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 She also had a parathyroidectomy in 2020.  She is currently working full-time and has taken a break from school. She plans to return to school but is focused on managing her finances and responsibilities. She reports inconsistent eating habits, sometimes waiting until late in the day to eat, which occasionally causes stomach pain. No constipation, regular bowel movements, and urination four to five times a day.       05/27/2024    9:09 AM  GAD 7 : Generalized Anxiety Score  Nervous, Anxious, on Edge 2  Control/stop worrying 3  Worry too much - different things 3  Trouble relaxing 3  Restless 3  Easily annoyed or irritable 3  Afraid - awful might happen 3  Total GAD 7 Score 20  Anxiety Difficulty Somewhat difficult    Flowsheet Row Office Visit from 05/27/2024 in [facility]  PHQ-9 Total Score 16     Past Medical History:  Diagnosis Date   Anxiety    Depression    Eczema    GERD (gastroesophageal reflux disease)    Graves disease    Hashimoto's disease    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 (Patient not taking: Reported  on 02/04/2023)   [DISCONTINUED] Calcium -Phosphorus-Vitamin D  (CALCIUM  GUMMIES PO) Take 4 tablets by mouth daily. (Patient not taking: Reported on 02/04/2023)   [DISCONTINUED] calcium -vitamin D  (OSCAL 500/200 D-3) 500-200 MG-UNIT tablet Take 1 tablet by mouth 3 (three) times daily for 14 days.   [DISCONTINUED] cetirizine (ZYRTEC) 10 MG chewable tablet Chew 10 mg by mouth daily as needed (allergies.).   [DISCONTINUED] doxycycline  (VIBRAMYCIN ) 100 MG capsule Take 1 capsule (100 mg total) by mouth 2 (two) times daily.   [DISCONTINUED] estradiol  (CLIMARA )  0.0375 mg/24hr patch Place 1 patch (0.0375 mg total) onto the skin once a week.   [DISCONTINUED] FLUoxetine  (PROZAC ) 20 MG capsule Take 20 mg by mouth daily.  (Patient not taking: Reported on 02/04/2023)   [DISCONTINUED] HYDROCORTISONE  EX Apply 1 application  topically 4 (four) times daily as needed (eczema).   [DISCONTINUED] hydrOXYzine  (VISTARIL ) 25 MG capsule Take 25 mg by mouth at bedtime. (Patient not taking: Reported on 02/04/2023)   [DISCONTINUED] Lactobacillus Rhamnosus, GG, (CULTURELLE) CAPS Take by mouth.   [DISCONTINUED] levothyroxine  (SYNTHROID ) 175 MCG tablet TAKE 1 TABLET BY MOUTH DAILY FOR 6 DAYS A WEEK, TAKE 1/2 TABLET ON SEVENTH DAY   [DISCONTINUED] Melatonin 3 MG TABS Take 3 mg by mouth at bedtime as needed (sleep).   [DISCONTINUED] Multiple Vitamins-Minerals (MULTIVITAMIN ADULTS) TABS Take by mouth. (Patient not taking: Reported on 02/04/2023)   [DISCONTINUED] polyethylene glycol powder (GLYCOLAX /MIRALAX ) 17 GM/SCOOP powder Take 17 g by mouth daily as needed (constipation). (Patient not taking: Reported on 02/04/2023)   [DISCONTINUED] Probiotic Product (PROBIOTIC PO) Take 1 capsule by mouth daily as needed (digestive regularity). (Patient not taking: Reported on 02/04/2023)   [DISCONTINUED] sertraline (ZOLOFT) 50 MG tablet Take 50 mg by mouth once daily   [DISCONTINUED] Skin Protectants, Misc. (EUCERIN) cream Apply 1 application topically 4 (four) times daily as needed (eczema).  (Patient not taking: Reported on 02/04/2023)   [DISCONTINUED] spironolactone  (ALDACTONE ) 50 MG tablet TAKE 1 TABLET(50 MG) BY MOUTH DAILY   [DISCONTINUED] tretinoin (RETIN-A) 0.05 % cream    No facility-administered medications prior to visit.    Past Surgical History:  Procedure Laterality Date   ORCHIOPEXY N/A 09/01/2017   Procedure: SCROTAL EXPLORATION, RIGHT TESTICULAR DETORSION, BILATERAL ORCHIOPEXY;  Surgeon: Chuckie Casimiro KIDD, MD;  Location: [facility] OR;  Service: Pediatrics;  Laterality: N/A;    PARATHYROIDECTOMY N/A 06/01/2019   Procedure: PARATHYROIDECTOMY AUTOTRANSPLANT;  Surgeon: Marolyn Nest, MD;  Location: [facility] ORS;  Service: General;  Laterality: N/A;   REDUCTION OF TORSION OF TESTIS Bilateral    THYROIDECTOMY N/A 06/01/2019   Procedure: THYROIDECTOMY;  Surgeon: Marolyn Nest, MD;  Location: [facility] ORS;  Service: General;  Laterality: N/A;   Family Status  Relation Name Status   Mother  Alive   Father  Alive   MGM  Alive   MGF  Alive   PGM  Alive   PGF  Deceased at age 49  No partnership data on file   Family History  Problem Relation Age of Onset   Post-traumatic stress disorder Father    Hypertension Father    Hyperlipidemia Father    Hypertension Maternal Grandmother    Lupus Paternal Grandfather    Social History   Socioeconomic History   Marital status: Single    Spouse name: Not on file   Number of children: Not on file   Years of education: Not on file   Highest education level: Some college, no degree  Occupational History   Not on file  Tobacco Use   Smoking  status: Never    Passive exposure: Yes   Smokeless tobacco: Never   Tobacco comments:    family smokes outside  Vaping Use   Vaping status: Never Used  Substance and Sexual Activity   Alcohol use: Yes    Alcohol/week: 3.0 standard drinks of alcohol    Types: 3 Shots of liquor per week    Comment: I dont consume weekly Id say once every couple of weeks Ill have some shots while Im out   Drug use: Yes    Frequency: 4.0 times per week    Types: Marijuana   Sexual activity: Yes    Birth control/protection: None  Other Topics Concern   Not on file  Social History Narrative   Lives at with mom, brother, and dad.    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   She enjoys acting, walking, and hanging out with friends.    Social Drivers of Health   Financial Resource Strain: Medium Risk (05/26/2024)   Overall  Financial Resource Strain (CARDIA)    Difficulty of Paying Living Expenses: Somewhat hard  Food Insecurity: Food Insecurity Present (05/26/2024)   Hunger Vital Sign    Worried About Running Out of Food in the Last Year: Sometimes true    Ran Out of Food in the Last Year: Sometimes true  Transportation Needs: No Transportation Needs (05/26/2024)   PRAPARE - Administrator, Civil Service (Medical): No    Lack of Transportation (Non-Medical): No  Physical Activity: Insufficiently Active (05/26/2024)   Exercise Vital Sign    Days of Exercise per Week: 3 days    Minutes of Exercise per Session: 20 min  Stress: Stress Concern Present (05/26/2024)   [employer] of Occupational Health - Occupational Stress Questionnaire    Feeling of Stress: Very much  Social Connections: Socially Isolated (05/26/2024)   Social Connection and Isolation Panel    Frequency of Communication with Friends and Family: More than three times a week    Frequency of Social Gatherings with Friends and Family: Twice a week    Attends Religious Services: Never    Database Administrator or Organizations: No    Attends Banker Meetings: Not on file    Marital Status: Never married     No Known Allergies  Immunization History  Administered Date(s) Administered   DTaP 08/26/2003, 10/26/2003, 12/24/2003, 09/25/2004, 12/04/2007   DTaP / IPV 12/04/2007   Dtap, Unspecified 12/24/2003, 09/25/2004   Fluzone Influenza virus vaccine,trivalent (IIV3), split virus 05/24/2011   H1N1 03/30/2008   HIB, Unspecified 08/26/2003, 10/26/2003, 12/24/2003, 06/29/2004   Hep A, Unspecified 09/25/2004, 04/05/2005   Hep B, Unspecified 03/21/03, 07/26/2003, 10/26/2003, 12/24/2003   Hepatitis A, Adult 09/25/2004, 04/05/2005   Hepatitis A, Ped/Adol-2 Dose 09/25/2004, 04/05/2005   Hepatitis B, PED/ADOLESCENT 2002-10-05, 07/26/2003, 10/26/2003, 12/24/2003   IPV 08/26/2003, 10/26/2003, 12/24/2003, 12/04/2007    Influenza, Mdck, Trivalent,PF 6+ MOS(egg free) 05/24/2011   Influenza,Quad,Nasal, Live 07/08/2012   Influenza,inj,Quad PF,6+ Mos 02/26/2019   Influenza-Unspecified 03/30/2008, 02/26/2019   MMR 06/29/2004, 12/04/2007   PFIZER(Purple Top)SARS-COV-2 Vaccination 11/05/2019, 11/26/2019   Pneumococcal Conjugate PCV 7 08/26/2003, 10/26/2003, 12/24/2003, 06/29/2004   Pneumococcal Conjugate,unspecified 08/26/2003, 10/26/2003, 12/24/2003, 06/29/2004   Pneumococcal-Unspecified 08/26/2003, 10/26/2003, 12/24/2003, 06/29/2004   Polio, Unspecified 12/24/2003   Tdap 08/12/2023   Varicella 06/29/2004, 12/04/2007    Health Maintenance  Topic Date Due   HPV VACCINES (1 - 3-dose series) Never done  Meningococcal B Vaccine (1 of 2 - Standard) Never done   Hepatitis C Screening  Never done   Pneumococcal Vaccine (1 of 2 - PCV) 06/15/2022   Influenza Vaccine  01/17/2024   COVID-19 Vaccine (3 - Pfizer risk series) 06/12/2024 (Originally 12/24/2019)   DTaP/Tdap/Td (7 - Td or Tdap) 08/11/2033   HIV Screening  Completed   Hepatitis B Vaccines 19-59 Average Risk  Discontinued    Patient Care Team: Sharma Coyer, MD as PCP - General (Family Medicine)  Review of Systems  Last CBC Lab Results  Component Value Date   WBC 3.4 (L) 02/04/2023   HGB 15.8 02/04/2023   HCT 44.7 02/04/2023   MCV 95.3 02/04/2023   MCH 33.7 (H) 02/04/2023   RDW 11.7 02/04/2023   PLT 290 02/04/2023   Last metabolic panel Lab Results  Component Value Date   GLUCOSE 76 02/04/2023   NA 139 02/04/2023   K 4.0 02/04/2023   CL 102 02/04/2023   CO2 24 02/04/2023   BUN 15 02/04/2023   CREATININE 1.21 02/04/2023   CALCIUM  9.8 02/04/2023   PROT 7.5 02/04/2023   ALBUMIN 4.0 06/02/2019   BILITOT 0.7 02/04/2023   ALKPHOS 202 10/10/2016   AST 19 02/04/2023   ALT 14 02/04/2023   Last lipids Lab Results  Component Value Date   CHOL 159 07/10/2022   HDL 53 07/10/2022   LDLCALC 91 07/10/2022   TRIG 63 07/10/2022    CHOLHDL 3.0 07/10/2022   Last hemoglobin A1c No results found for: HGBA1C Last thyroid  functions Lab Results  Component Value Date   TSH 0.14 (L) 07/10/2022   FREET4 1.8 (H) 07/10/2022   THYROIDAB >900 (H) 04/18/2018   Last vitamin D  No results found for: 25OHVITD2, 25OHVITD3, VD25OH      Objective    BP 100/69 (BP Location: Right Arm, Patient Position: Sitting, Cuff Size: Normal)   Pulse (!) 54   Temp 98.4 F (36.9 C) (Oral)   Ht 6' 2 (1.88 m)   Wt 176 lb 12.8 oz (80.2 kg)   SpO2 100%   BMI 22.70 kg/m  BP Readings from Last 3 Encounters:  05/27/24 100/69  11/13/23 106/67  02/04/23 108/72   Wt Readings from Last 3 Encounters:  05/27/24 176 lb 12.8 oz (80.2 kg)  11/13/23 185 lb (83.9 kg)  02/04/23 175 lb (79.4 kg) (77%, Z= 0.73)*   * Growth percentiles are based on CDC (Boys, 2-20 Years) data.        Depression Screen    05/27/2024    9:09 AM  PHQ 2/9 Scores  PHQ - 2 Score 4  PHQ- 9 Score 16   No results found for any visits on 05/27/24.   Physical Exam Vitals reviewed.  Constitutional:      General: She is not in acute distress.    Appearance: Normal appearance. She is not ill-appearing.  Cardiovascular:     Rate and Rhythm: Normal rate and regular rhythm.  Pulmonary:     Effort: Pulmonary effort is normal. No respiratory distress.     Breath sounds: No wheezing, rhonchi or rales.  Musculoskeletal:     Right lower leg: No edema.     Left lower leg: No edema.  Neurological:     Mental Status: She is alert and oriented to person, place, and time.  Psychiatric:        Attention and Perception: Attention normal. She is attentive.        Mood and Affect: Mood normal.  Behavior: Behavior normal.    Physical Exam       Assessment & Plan      Problem List Items Addressed This Visit     Autoimmune thyroiditis   Hx of thyroiditis  Referred to endocrinology       Relevant Medications   levothyroxine  (SYNTHROID ) 175 MCG  tablet   Other Relevant Orders   Ambulatory referral to Endocrinology   TSH + free T4   CBC   Depression, major, single episode, moderate (HCC) - Primary   Chronic condition  - Referred to psychiatry for medication management and therapy. - Recommended using [email or web address] to find a therapist or counselor. - Restart Zoloft and continue for at least 6 weeks to assess efficacy      Relevant Medications   busPIRone (BUSPAR) 10 MG tablet   sertraline (ZOLOFT) 50 MG tablet   Other Relevant Orders   Ambulatory referral to Psychiatry   TSH + free T4   GAD (generalized anxiety disorder)   Chronic condition  Experiencing fluctuating symptoms of anxiety and depression, including anxiety, worry, and feelings of hopelessness. Previously on Zoloft for 1-2 months but discontinued due to insurance issues. Currently not on any medication for anxiety or depression. Reports recent improvement in mood but still experiences overwhelming responsibilities and coping challenges. - Referred to psychiatry for medication management and therapy. - Recommended using [email or web address] to find a therapist or counselor. - Restart Zoloft and continue for at least 6 weeks to assess efficacy. - Prescribed buspirone as needed for anxiety, up to twice daily.      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 Previously on estrogen therapy with dosage increase by endocrinologist. Lost contact with endocrinologist due to insurance changes and relocation. Currently without a referral to a new endocrinologist. - Referred to endocrinology for management of hormone therapy.      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 Not currently taking Synthroid . Thyroid  levels were abnormal last year, necessitating updated labs and endocrinology referral. - Ordered TSH, T4, T3, CBC, and CMP. - Referred to endocrinology for further management. - Restart Synthroid  175 mcg daily.      Relevant Medications   levothyroxine  (SYNTHROID ) 175 MCG tablet   Other Relevant Orders   Ambulatory referral to Endocrinology   TSH + free T4   CBC   S/P total thyroidectomy   Referred to endocrinology  Restarted synthroid  175mcg daily  TSH and T4 measured today       Relevant Medications   levothyroxine  (SYNTHROID ) 175 MCG tablet   Other Relevant Orders   Ambulatory referral to Endocrinology   TSH + free T4   CBC   Other Visit Diagnoses       History of parathyroidectomy         Immunization due         Abnormal transaminases       Relevant Orders   CMP14+EGFR     Encounter for hepatitis C screening test for low risk patient  Relevant Orders   Hepatitis C antibody       Assessment and Plan Assessment & Plan  General health maintenance Due for flu shot and hepatitis C screening. - Administered flu shot. - Ordered hepatitis C screening with blood work.      Return in about 2 months (around 07/28/2024) for Anxiety, Depression .      Rockie Agent, MD  [facility] [phone] (phone) [phone] (fax)  [facility]

## 2024-05-27 NOTE — Assessment & Plan Note (Signed)
 Chronic condition  - Referred to psychiatry for medication management and therapy. - Recommended using [email or web address] to find a therapist or counselor. - Restart Zoloft and continue for at least 6 weeks to assess efficacy

## 2024-05-27 NOTE — Assessment & Plan Note (Signed)
 Referred to endocrinology  Restarted synthroid  175mcg daily  TSH and T4 measured today

## 2024-05-27 NOTE — Assessment & Plan Note (Signed)
 Chronic  Previously on spironolactone  and estrogen patches  Has self discontinued in recent years  Referred to endocrinology to continue with treatment

## 2024-05-27 NOTE — Patient Instructions (Addendum)
 [email or web address] - please use this website to search for a counselor/therapist     To keep you healthy, please keep in mind the following health maintenance items that you are due for:   Health Maintenance Due  Topic Date Due   HPV VACCINES (1 - 3-dose series) Never done   Meningococcal B Vaccine (1 of 2 - Standard) Never done   Hepatitis C Screening  Never done   Pneumococcal Vaccine (1 of 2 - PCV) 06/15/2022   Influenza Vaccine  01/17/2024     Best Wishes,   Dr. Lang

## 2024-05-27 NOTE — Assessment & Plan Note (Signed)
 Chronic condition  Total thyroidectomy in 2020. Not currently taking Synthroid . Thyroid  levels were abnormal last year, necessitating updated labs and endocrinology referral. - Ordered TSH, T4, T3, CBC, and CMP. - Referred to endocrinology for further management. - Restart Synthroid  175 mcg daily.

## 2024-05-27 NOTE — Assessment & Plan Note (Signed)
 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 Previously on estrogen therapy with dosage increase by endocrinologist. Lost contact with endocrinologist due to insurance changes and relocation. Currently without a referral to a new endocrinologist. - Referred to endocrinology for management of hormone therapy.

## 2024-05-27 NOTE — Assessment & Plan Note (Signed)
 Chronic condition  Experiencing fluctuating symptoms of anxiety and depression, including anxiety, worry, and feelings of hopelessness. Previously on Zoloft for 1-2 months but discontinued due to insurance issues. Currently not on any medication for anxiety or depression. Reports recent improvement in mood but still experiences overwhelming responsibilities and coping challenges. - Referred to psychiatry for medication management and therapy. - Recommended using [email or web address] to find a therapist or counselor. - Restart Zoloft and continue for at least 6 weeks to assess efficacy. - Prescribed buspirone as needed for anxiety, up to twice daily.

## 2024-05-28 LAB — CBC
Hematocrit: 40.9 % (ref 37.5–51.0)
Hemoglobin: 13.5 g/dL (ref 13.0–17.7)
MCH: 33.8 pg — ABNORMAL HIGH (ref 26.6–33.0)
MCHC: 33 g/dL (ref 31.5–35.7)
MCV: 103 fL — ABNORMAL HIGH (ref 79–97)
Platelets: 212 x10E3/uL (ref 150–450)
RBC: 3.99 x10E6/uL — ABNORMAL LOW (ref 4.14–5.80)
RDW: 12.1 % (ref 11.6–15.4)
WBC: 3.1 x10E3/uL — ABNORMAL LOW (ref 3.4–10.8)

## 2024-05-28 LAB — CMP14+EGFR
ALT: 24 IU/L (ref 0–44)
AST: 31 IU/L (ref 0–40)
Albumin: 4.8 g/dL (ref 4.3–5.2)
Alkaline Phosphatase: 50 IU/L — ABNORMAL LOW (ref 51–125)
BUN/Creatinine Ratio: 9 (ref 9–20)
BUN: 13 mg/dL (ref 6–20)
Bilirubin Total: 0.6 mg/dL (ref 0.0–1.2)
CO2: 24 mmol/L (ref 20–29)
Calcium: 9.7 mg/dL (ref 8.7–10.2)
Chloride: 99 mmol/L (ref 96–106)
Creatinine, Ser: 1.39 mg/dL — ABNORMAL HIGH (ref 0.76–1.27)
Globulin, Total: 2.7 g/dL (ref 1.5–4.5)
Glucose: 84 mg/dL (ref 70–99)
Potassium: 4 mmol/L (ref 3.5–5.2)
Sodium: 139 mmol/L (ref 134–144)
Total Protein: 7.5 g/dL (ref 6.0–8.5)
eGFR: 74 mL/min/1.73 (ref >59)

## 2024-05-28 LAB — HEPATITIS C ANTIBODY: Hep C Virus Ab: NONREACTIVE

## 2024-05-28 LAB — TSH+FREE T4
Free T4: 0.11 ng/dL — ABNORMAL LOW (ref 0.82–1.77)
TSH: 768 u[IU]/mL — ABNORMAL HIGH (ref 0.450–4.500)

## 2024-06-03 ENCOUNTER — Ambulatory Visit: Payer: Self-pay | Admitting: Family Medicine

## 2024-06-03 DIAGNOSIS — D72819 Decreased white blood cell count, unspecified: Secondary | ICD-10-CM | POA: Insufficient documentation

## 2024-06-03 DIAGNOSIS — D72818 Other decreased white blood cell count: Secondary | ICD-10-CM

## 2024-06-03 NOTE — Telephone Encounter (Signed)
-----   Message from Rockie Agent, MD sent at 06/03/2024  7:48 AM EST ----- Severely elevated TSH of 768, normal range is 4.5 or less. Please continue synthroid  as directed and we will recheck these levels around 06/27/24 for a lab only visit. Please order TSH and T4 & CBC  (low WBC and high MCV) and advise patient to return without appt

## 2024-08-19 ENCOUNTER — Ambulatory Visit: Admitting: Family Medicine
